# Patient Record
Sex: Male | Born: 1985 | Race: White | Hispanic: No | Marital: Single | Smoking: Never smoker
Health system: Southern US, Community
[De-identification: ages and names within clinical notes are randomized; demographics above are authoritative.]

## PROBLEM LIST (undated history)

## (undated) HISTORY — PX: WISDOM TOOTH EXTRACTION: SHX21

---

## 2014-12-03 ENCOUNTER — Ambulatory Visit
Admit: 2014-12-03 | Disposition: A | Discharge status: Home / Self Care | Payer: Self-pay | Attending: Family Medicine | Admitting: Family Medicine

## 2014-12-10 ENCOUNTER — Ambulatory Visit
Admit: 2014-12-10 | Disposition: A | Discharge status: Home / Self Care | Payer: Self-pay | Attending: Family Medicine | Admitting: Family Medicine

## 2015-02-04 ENCOUNTER — Encounter: Payer: Self-pay | Admitting: Emergency Medicine

## 2015-02-04 ENCOUNTER — Ambulatory Visit
Admission: EM | Admit: 2015-02-04 | Discharge: 2015-02-04 | Disposition: A | Discharge status: Home / Self Care | Payer: No Typology Code available for payment source | Attending: Registered Nurse | Admitting: Registered Nurse

## 2015-02-04 DIAGNOSIS — J029 Acute pharyngitis, unspecified: Secondary | ICD-10-CM | POA: Diagnosis not present

## 2015-02-04 DIAGNOSIS — H66003 Acute suppurative otitis media without spontaneous rupture of ear drum, bilateral: Secondary | ICD-10-CM | POA: Insufficient documentation

## 2015-02-04 LAB — RAPID STREP SCREEN (MED CTR MEBANE ONLY): Streptococcus, Group A Screen (Direct): NEGATIVE

## 2015-02-04 MED ORDER — AZITHROMYCIN 250 MG PO TABS: 250.0000 mg | ORAL_TABLET | Freq: Every day | ORAL | Status: DC

## 2015-02-04 MED ORDER — ACETAMINOPHEN 500 MG PO TABS: 500.0000 mg | ORAL_TABLET | Freq: Four times a day (QID) | ORAL | Status: DC | PRN

## 2015-02-04 MED ORDER — ERYTHROMYCIN BASE 250 MG PO TABS: 250.0000 mg | ORAL_TABLET | Freq: Four times a day (QID) | ORAL | Status: DC

## 2015-02-04 MED ORDER — ALUMINUM & MAGNESIUM HYDROXIDE 200-200 MG/5ML PO SUSP: 5.0000 mL | Freq: Four times a day (QID) | ORAL | Status: DC | PRN

## 2015-02-04 MED ORDER — DIPHENHYDRAMINE HCL 12.5 MG/5ML PO SYRP: 25.0000 mg | ORAL_SOLUTION | ORAL | Status: DC | PRN

## 2015-02-04 NOTE — ED Provider Notes (Signed)
CSN: [id number]     Arrival date & time 02/04/15  0709 History   None    Chief Complaint  Patient presents with  . 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 Hurts to talk. Scheduled to work today not feeling well enough.  The history is provided by the patient.    History reviewed. No pertinent past medical history. History reviewed. No pertinent past surgical history. History reviewed. No pertinent family history. History  Substance Use Topics  . Smoking status: Never Smoker   . Smokeless tobacco: Never Used  . Alcohol Use: No    Review of Systems  Constitutional: Positive for fatigue. Negative for fever, chills, diaphoresis, activity change and appetite change.  HENT: Positive for sore throat and voice change. Negative for congestion, dental problem, drooling, ear discharge, ear pain, facial swelling, hearing loss, mouth sores, nosebleeds, postnasal drip, rhinorrhea, sinus pressure, sneezing, tinnitus and trouble swallowing.   Eyes: Negative for photophobia, pain, discharge, redness, itching and visual disturbance.  Respiratory: Positive for cough. Negative for choking, chest tightness, shortness of breath, wheezing and stridor.   Cardiovascular: Negative for chest pain, palpitations and leg swelling.  Gastrointestinal: Negative for nausea, vomiting, abdominal pain, diarrhea, constipation, blood in stool, abdominal distention, anal bleeding and rectal pain.  Endocrine: Negative for cold intolerance and heat intolerance.  Genitourinary: Negative for hematuria and difficulty urinating.  Musculoskeletal: Negative for myalgias, back pain, joint swelling, arthralgias, gait problem, neck pain and neck stiffness.  Skin: Negative for color change, pallor, rash and wound.  Allergic/Immunologic: Negative  for environmental allergies and food allergies.  Neurological: Positive for headaches. Negative for dizziness, tremors, seizures, syncope, facial asymmetry, speech difficulty, weakness, light-headedness and numbness.  Hematological: Negative for adenopathy. Does not bruise/bleed easily.  Psychiatric/Behavioral: Negative for behavioral problems, confusion, sleep disturbance and agitation.    Allergies  Penicillins  Home Medications   Prior to Admission medications   Medication Sig Start Date End Date Taking? Authorizing Provider  acetaminophen (TYLENOL) 500 MG tablet Take 1 tablet (500 mg total) by mouth every 6 (six) hours as needed. 02/04/15   Barbaraann Barthel, NP  aluminum-magnesium hydroxide 200-200 MG/5ML suspension Take 5 mLs by mouth every 6 (six) hours as needed for indigestion. 02/04/15   Barbaraann Barthel, NP  azithromycin (ZITHROMAX) 250 MG tablet Take 1 tablet (250 mg total) by mouth daily. Take first 2 tablets together, then 1 every day until finished. 02/04/15   Barbaraann Barthel, NP  diphenhydrAMINE (BENYLIN) 12.5 MG/5ML syrup Take 10 mLs (25 mg total) by mouth every 4 (four) hours as needed for allergies. 02/04/15   Barbaraann Barthel, NP  erythromycin (E-MYCIN) 250 MG tablet Take 1 tablet (250 mg total) by mouth every 6 (six) hours. 02/04/15   Jarold Song Betancourt, NP   BP 119/68 mmHg  Pulse 50  Temp(Src) 96 F (35.6 C) (Tympanic)  Resp 16  Ht  (1.778 m)  Wt 145 lb (65.772 kg)  BMI 20.81 kg/m2  SpO2 99% Physical Exam  Constitutional: He is oriented to person, place, and time. Vital signs are normal. He appears well-developed and well-nourished. No distress.  HENT:  Head: Normocephalic and atraumatic.  Right Ear: Hearing, external ear and ear canal normal. Tympanic membrane is injected, erythematous and bulging. A middle ear effusion is present.  Left Ear: Hearing, external ear and ear canal normal. Tympanic membrane is injected,  erythematous and bulging. A middle ear  effusion is present.  Nose: Nose normal.  Mouth/Throat: Oropharynx is clear and moist. No oropharyngeal exudate.  Opaque air fluid level bilateral TMs; bilateral nasal turbinates with edema/erythema clear discharge; cobblestoning posterior pharynx tonsils with edema/erythema 2+/4 bilaterally nasal congestion  Eyes: Conjunctivae, EOM and lids are normal. Pupils are equal, round, and reactive to light. Right eye exhibits no discharge. Left eye exhibits no discharge. No scleral icterus.  Neck: Trachea normal and normal range of motion. Neck supple. No tracheal deviation present. No thyromegaly present.  Cardiovascular: Normal rate, regular rhythm, normal heart sounds and intact distal pulses.  Exam reveals no gallop and no friction rub.   No murmur heard. Pulmonary/Chest: Effort normal and breath sounds normal. No stridor. No respiratory distress. He has no wheezes. He has no rales. He exhibits no tenderness.  Abdominal: Soft. Bowel sounds are normal. He exhibits no distension and no mass. There is no tenderness. There is no rebound and no guarding.  Musculoskeletal: Normal range of motion. He exhibits no edema or tenderness.  Lymphadenopathy:    He has no cervical adenopathy.  Neurological: He is alert and oriented to person, place, and time. Coordination normal.  Skin: Skin is warm, dry and intact. No rash noted. He is not diaphoretic. No erythema. No pallor.  Psychiatric: He has a normal mood and affect. His speech is normal and behavior is normal. Judgment and thought content normal. Cognition and memory are normal.  Nursing note and vitals reviewed.   ED Course  Procedures (including critical care time) Labs Review Labs Reviewed  RAPID STREP SCREEN (NOT AT [facility])  CULTURE, GROUP A STREP ([facility] ONLY)    Imaging Review No results found.   MDM   1. Acute suppurative otitis media of both ears without spontaneous rupture of tympanic membranes, recurrence not specified   2. Pharyngitis     Treatment as ordered. Patient with penicillin allergy did not want to change cephalosporin use requested other medication without possible cross reactivity preferred so will Rx erythromycin and azithromycin. Symptomatic therapy suggested fluids, NSAIDs and rest.  May take Tylenol or Motrin for fevers.  Call or return to clinic as needed if these symptoms worsen or fail to improve as anticipated. Exit[employer] handout on otitis media given to patient.  Patient verbalized agreement and understanding of treatment plan.   P2:  Hand washing School/work excuse note given to patient for 24 hours. Patient notified rapid strep negative.  Throat culture results typically available in 48 hours.  If symptoms worsening/not improving on erythromycin and azithromycin consider doxycycline if positive throat culture.  Will try maalox and benadryl liquid for sore throat along with NSAID.  Consider dukes mouthwash 15ml po TID discussed with patient preferred no Rx for it at this time.  Usually no specific medical treatment is needed if a virus is causing the sore throat.  The throat most often gets better on its own within 5 to 7 days.  Antibiotic medicine does not cure viral pharyngitis.   For acute pharyngitis caused by bacteria, your healthcare provider will prescribe an antibiotic.  Marland Kitchen Do not smoke.  Marland Kitchen Avoid secondhand smoke and other air pollutants.  . Use a cool mist humidifier to add moisture to the air.  . Get plenty of rest.  . You may want to rest your throat by talking less and eating a diet that is mostly liquid or soft for a day or two.   Marland Kitchen Nonprescription throat lozenges and mouthwashes  should help relieve the soreness.   . Gargling with warm saltwater and drinking warm liquids may help.  (You can make a saltwater solution by adding 1/4 teaspoon of salt to 8 ounces, or 240 mL, of warm water.)  . A nonprescription pain reliever such as aspirin, acetaminophen, or ibuprofen may ease general aches and pains.    FOLLOW UP with clinic provider if no improvements in the next 7-10 days.  Patient verbalized understanding of instructions and agreed with plan of care. P2:  Hand washing and diet.    Barbaraann Barthel, NP 02/04/15 [phone]

## 2015-02-04 NOTE — ED Notes (Signed)
Patient c/o sore throat for the past 2 days.  Patient denies fevers.  

## 2015-02-04 NOTE — Discharge Instructions (Signed)
Pharyngitis °Pharyngitis is redness, pain, and swelling (inflammation) of your pharynx.  °CAUSES  °Pharyngitis is usually caused by infection. Most of the time, these infections are from viruses (viral) and are part of a cold. However, sometimes pharyngitis is caused by bacteria (bacterial). Pharyngitis can also be caused by allergies. Viral pharyngitis may be spread from person to person by coughing, sneezing, and personal items or utensils (cups, forks, spoons, toothbrushes). Bacterial pharyngitis may be spread from person to person by more intimate contact, such as kissing.  °SIGNS AND SYMPTOMS  °Symptoms of pharyngitis include:   °· Sore throat.   °· Tiredness (fatigue).   °· Low-grade fever.   °· Headache. °· Joint pain and muscle aches. °· Skin rashes. °· Swollen lymph nodes. °· Plaque-like film on throat or tonsils (often seen with bacterial pharyngitis). °DIAGNOSIS  °Your health care provider will ask you questions about your illness and your symptoms. Your medical history, along with a physical exam, is often all that is needed to diagnose pharyngitis. Sometimes, a rapid strep test is done. Other lab tests may also be done, depending on the suspected cause.  °TREATMENT  °Viral pharyngitis will usually get better in 3-4 days without the use of medicine. Bacterial pharyngitis is treated with medicines that kill germs (antibiotics).  °HOME CARE INSTRUCTIONS  °· Drink enough water and fluids to keep your urine clear or pale yellow.   °· Only take over-the-counter or prescription medicines as directed by your health care provider:   °¨ If you are prescribed antibiotics, make sure you finish them even if you start to feel better.   °¨ Do not take aspirin.   °· Get lots of rest.   °· Gargle with 8 oz of salt water (½ tsp of salt per 1 qt of water) as often as every 1-2 hours to soothe your throat.   °· Throat lozenges (if you are not at risk for choking) or sprays may be used to soothe your throat. °SEEK MEDICAL  CARE IF:  °· You have large, tender lumps in your neck. °· You have a rash. °· You cough up green, yellow-brown, or bloody spit. °SEEK IMMEDIATE MEDICAL CARE IF:  °· Your neck becomes stiff. °· You drool or are unable to swallow liquids. °· You vomit or are unable to keep medicines or liquids down. °· You have severe pain that does not go away with the use of recommended medicines. °· You have trouble breathing (not caused by a stuffy nose). °MAKE SURE YOU:  °· Understand these instructions. °· Will watch your condition. °· Will get help right away if you are not doing well or get worse. °Document Released: 08/03/2005 Document Revised: 05/24/2013 Document Reviewed: 04/10/2013 °ExitCare® Patient Information ©2015 [employer]. This information is not intended to replace advice given to you by your health care provider. Make sure you discuss any questions you have with your health care provider. °Otitis Media °Otitis media is redness, soreness, and inflammation of the middle ear. Otitis media may be caused by allergies or, most commonly, by infection. Often it occurs as a complication of the common cold. °SIGNS AND SYMPTOMS °Symptoms of otitis media may include: °· Earache. °· Fever. °· Ringing in your ear. °· Headache. °· Leakage of fluid from the ear. °DIAGNOSIS °To diagnose otitis media, your health care provider will examine your ear with an otoscope. This is an instrument that allows your health care provider to see into your ear in order to examine your eardrum. Your health care provider also will ask you questions about your   symptoms. °TREATMENT  °Typically, otitis media resolves on its own within 3-5 days. Your health care provider may prescribe medicine to ease your symptoms of pain. If otitis media does not resolve within 5 days or is recurrent, your health care provider may prescribe antibiotic medicines if he or she suspects that a bacterial infection is the cause. °HOME CARE INSTRUCTIONS  °· If you were  prescribed an antibiotic medicine, finish it all even if you start to feel better. °· Take medicines only as directed by your health care provider. °· Keep all follow-up visits as directed by your health care provider. °SEEK MEDICAL CARE IF: °· You have otitis media only in one ear, or bleeding from your nose, or both. °· You notice a lump on your neck. °· You are not getting better in 3-5 days. °· You feel worse instead of better. °SEEK IMMEDIATE MEDICAL CARE IF:  °· You have pain that is not controlled with medicine. °· You have swelling, redness, or pain around your ear or stiffness in your neck. °· You notice that part of your face is paralyzed. °· You notice that the bone behind your ear (mastoid) is tender when you touch it. °MAKE SURE YOU:  °· Understand these instructions. °· Will watch your condition. °· Will get help right away if you are not doing well or get worse. °Document Released: 05/08/2004 Document Revised: 12/18/2013 Document Reviewed: 02/28/2013 °ExitCare® Patient Information ©2015 [employer]. This information is not intended to replace advice given to you by your health care provider. Make sure you discuss any questions you have with your health care provider. ° °

## 2015-02-06 LAB — CULTURE, GROUP A STREP (THRC)

## 2015-07-17 ENCOUNTER — Emergency Department (HOSPITAL_COMMUNITY): Payer: No Typology Code available for payment source

## 2015-07-17 ENCOUNTER — Encounter (HOSPITAL_COMMUNITY): Payer: Self-pay | Admitting: Emergency Medicine

## 2015-07-17 ENCOUNTER — Emergency Department (HOSPITAL_COMMUNITY)
Admission: EM | Admit: 2015-07-17 | Discharge: 2015-07-17 | Disposition: A | Discharge status: Home / Self Care | Payer: No Typology Code available for payment source | Attending: Emergency Medicine | Admitting: Emergency Medicine

## 2015-07-17 DIAGNOSIS — Y92410 Unspecified street and highway as the place of occurrence of the external cause: Secondary | ICD-10-CM | POA: Diagnosis not present

## 2015-07-17 DIAGNOSIS — M25561 Pain in right knee: Secondary | ICD-10-CM

## 2015-07-17 DIAGNOSIS — Y9389 Activity, other specified: Secondary | ICD-10-CM | POA: Diagnosis not present

## 2015-07-17 DIAGNOSIS — Z880 Allergy status to penicillin: Secondary | ICD-10-CM | POA: Insufficient documentation

## 2015-07-17 DIAGNOSIS — Z792 Long term (current) use of antibiotics: Secondary | ICD-10-CM | POA: Diagnosis not present

## 2015-07-17 DIAGNOSIS — S80211A Abrasion, right knee, initial encounter: Secondary | ICD-10-CM | POA: Diagnosis not present

## 2015-07-17 DIAGNOSIS — Y998 Other external cause status: Secondary | ICD-10-CM | POA: Diagnosis not present

## 2015-07-17 DIAGNOSIS — S60512A Abrasion of left hand, initial encounter: Secondary | ICD-10-CM | POA: Insufficient documentation

## 2015-07-17 DIAGNOSIS — S60511A Abrasion of right hand, initial encounter: Secondary | ICD-10-CM | POA: Diagnosis not present

## 2015-07-17 DIAGNOSIS — T148XXA Other injury of unspecified body region, initial encounter: Secondary | ICD-10-CM

## 2015-07-17 DIAGNOSIS — S8001XA Contusion of right knee, initial encounter: Secondary | ICD-10-CM | POA: Diagnosis not present

## 2015-07-17 DIAGNOSIS — S8991XA Unspecified injury of right lower leg, initial encounter: Secondary | ICD-10-CM | POA: Diagnosis present

## 2015-07-17 MED ORDER — ACETAMINOPHEN 325 MG PO TABS
650.0000 mg | ORAL_TABLET | Freq: Once | ORAL | Status: AC
  Administered 2015-07-17: 650 mg via ORAL
  Filled 2015-07-17: qty 2

## 2015-07-17 MED ORDER — BACITRACIN ZINC 500 UNIT/GM EX OINT
TOPICAL_OINTMENT | CUTANEOUS | Status: AC
  Administered 2015-07-17: 1
  Filled 2015-07-17: qty 2.7

## 2015-07-17 MED ORDER — NAPROXEN 500 MG PO TABS: 500.0000 mg | ORAL_TABLET | Freq: Two times a day (BID) | ORAL | Status: DC | PRN

## 2015-07-17 MED ORDER — HYDROCODONE-ACETAMINOPHEN 5-325 MG PO TABS: 1.0000 | ORAL_TABLET | Freq: Four times a day (QID) | ORAL | Status: DC | PRN

## 2015-07-17 NOTE — Progress Notes (Addendum)
[facility] is listed as pcp per [employer] coverage  Pt given a 7 page list of [facility] providers within pt zip code [phone] to assist with f/u care prior to leaving
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
## 2015-12-23 DIAGNOSIS — R7302 Impaired glucose tolerance (oral): Secondary | ICD-10-CM | POA: Insufficient documentation

## 2017-09-14 ENCOUNTER — Ambulatory Visit (INDEPENDENT_AMBULATORY_CARE_PROVIDER_SITE_OTHER): Payer: BLUE CROSS/BLUE SHIELD

## 2017-09-14 ENCOUNTER — Other Ambulatory Visit: Payer: Self-pay

## 2017-09-14 ENCOUNTER — Ambulatory Visit
Admission: EM | Admit: 2017-09-14 | Discharge: 2017-09-14 | Disposition: A | Discharge status: Home / Self Care | Payer: BLUE CROSS/BLUE SHIELD | Attending: Emergency Medicine | Admitting: Emergency Medicine

## 2017-09-14 DIAGNOSIS — J9801 Acute bronchospasm: Secondary | ICD-10-CM

## 2017-09-14 MED ORDER — AEROCHAMBER PLUS MISC: 2 refills | Status: DC

## 2017-09-14 MED ORDER — ALBUTEROL SULFATE HFA 108 (90 BASE) MCG/ACT IN AERS: 1.0000 | INHALATION_SPRAY | RESPIRATORY_TRACT | 0 refills | Status: DC | PRN

## 2017-09-14 NOTE — ED Triage Notes (Addendum)
Pt reports 2 months of "feeling like theres something in my lungs."  "I want a chest xray". Pt denies cough or SOB. Denies pain. States no aggravating or alleviating factors. States it is there most of the time

## 2017-09-14 NOTE — ED Provider Notes (Signed)
HPI  SUBJECTIVE:  Mark Galvan is a 32 y.o. male who presents with "crackling in my chest" for the past 1-2 months.  He states it is worse with being in the cold for prolonged periods of time, no alleviating factors.  He has not tried anything for this.  He denies nasal congestion.  He reports rhinorrhea when exposed to cold only.  No postnasal drip, sinus pain or pressure, cough, chest pain, wheezing, shortness of breath.  No fevers, unintentional weight loss, night sweats.  No lower extremity edema, unintentional weight gain, nocturia, orthopnea, PND, abdominal pain.  No pleuritic chest pain.  He works in a Higher education careers adviserfridge and freezer at [employer] and works for [employer] where he is exposed to Roundup in the summer.  He denies any other exposure to fumes, chemicals or smoke.  No recent international travel, he is not homeless, he has not been in prison.  Past medical history negative for GERD, asthma, emphysema, COPD, smoking, TB, cancer, CHF, pneumonia.  PMD: None.  History reviewed. No pertinent past medical history.  Past Surgical History:  Procedure Laterality Date  . WISDOM TOOTH EXTRACTION      Family History  Problem Relation Age of Onset  . Healthy Mother   . Arrhythmia Father     Social History   Tobacco Use  . Smoking status: Never Smoker  . Smokeless tobacco: Current User    Types: Snuff  Substance Use Topics  . Alcohol use: No  . Drug use: No    No current facility-administered medications for this encounter.   Current Outpatient Medications:  .  albuterol (PROVENTIL HFA;VENTOLIN HFA) 108 (90 Base) MCG/ACT inhaler, Inhale 1-2 puffs into the lungs every 4 (four) hours as needed for wheezing or shortness of breath., Disp: 1 Inhaler, Rfl: 0 .  Spacer/Aero-Holding Chambers (AEROCHAMBER PLUS) inhaler, Use as instructed, Disp: 1 each, Rfl: 2  Allergies  Allergen Reactions  . Penicillins Other (See Comments)    unknown     ROS  As noted in HPI.   Physical  Exam  BP 92/71 (BP Location: Left Arm)   Pulse (!) 59   Temp 98.1 F (36.7 C) (Oral)   Resp 18   Ht 5\' 10"  (1.778 m)   Wt 145 lb (65.8 kg)   SpO2 100%   BMI 20.81 kg/m   Constitutional: Well developed, well nourished, no acute distress Eyes:  EOMI, conjunctiva normal bilaterally HENT: Normocephalic, atraumatic,mucus membranes moist Respiratory: Normal inspiratory effort. lungs clear bilaterally, good air movement.  No chest wall tenderness. Cardiovascular: Normal rate regular rhythm, no murmurs, rubs, gallops GI: nondistended skin: No rash, skin intact Musculoskeletal: no deformities, calves symmetric, nontender, no edema Neurologic: Alert & oriented x 3, no focal neuro deficits Psychiatric: Speech and behavior appropriate   ED Course   Medications - No data to display  Orders Placed This Encounter  Procedures  . DG Chest 2 View    Standing Status:   Standing    Number of Occurrences:   1    Order Specific Question:   Reason for Exam (SYMPTOM  OR DIAGNOSIS REQUIRED)    Answer:   r/p pna , effusion, pulm edema, mass, TB    No results found for this or any previous visit (from the past 24 hour(s)). Dg Chest 2 View  Result Date: 09/14/2017 CLINICAL DATA:  Pt reports 2 months of "feeling like there is something in my lungs." "I want a chest xray". Pt states it sounds and feels  like crackling. Pt denies cough or SOB. Denies pain. States no aggravating or alleviating factors. States it is there most of the time EXAM: CHEST  2 VIEW COMPARISON:  None. FINDINGS: The heart size and mediastinal contours are within normal limits. Both lungs are clear. No pleural effusion or pneumothorax. The visualized skeletal structures are unremarkable. IMPRESSION: Normal chest radiographs. Electronically Signed   By: Amie Portland M.D.   On: 09/14/2017 15:54    ED Clinical Impression  Cold induced bronchospasm   ED Assessment/Plan  Will check chest x-ray to rule out mass.  Suspect that his  symptoms are bronchospasm from working in refrigerated environments.  If chest x-ray is negative, plan to send home with an albuterol inhaler with a spacer for him to use as needed.  Doubt CHF.  Will provide a primary care referral list and order primary care referral for him to follow-up with as needed especially if this does not resolve.  Discussed  medical decision making, treatment plan and plan for follow-up with patient.  He agrees with plan.  Reviewed imaging independently.  Normal chest x-ray see radiology report for full details.  Plan as above.  Meds ordered this encounter  Medications  . albuterol (PROVENTIL HFA;VENTOLIN HFA) 108 (90 Base) MCG/ACT inhaler    Sig: Inhale 1-2 puffs into the lungs every 4 (four) hours as needed for wheezing or shortness of breath.    Dispense:  1 Inhaler    Refill:  0  . Spacer/Aero-Holding Chambers (AEROCHAMBER PLUS) inhaler    Sig: Use as instructed    Dispense:  1 each    Refill:  2    *This clinic note was created using Dragon dictation software. Therefore, there may be occasional mistakes despite careful proofreading.   ?   Domenick Gong, MD 09/14/17 1601

## 2017-09-14 NOTE — Discharge Instructions (Signed)
Take two puffs from your albuterol inhaler every 4 hours or as needed for symptoms.  Follow-up with 1 of the primary care physicians below.  Here is a list of primary care providers who are taking new patients:  Dr. Elizabeth Sauereanna Jones, Dr. Schuyler AmorWilliam Plonk [address] [place] [phone]  [facility] [place] [address]  [place] [address]  [phone]  [facility] [address]  [place], [place] [address] [phone]  [facility] [place] [address]  [phone] [place], [place] [address]  Here are clinics/ other resources who will see you if you do not have insurance. Some have certain criteria that you must meet. Call them and find out what they are:  [facility]: [address] [place], [place] [address] Phone: [phone] Hours: First and Third Saturdays of each Month, 9 a.m. - 1 p.m.  [facility]: [address], [place], [place] [address] Phone: [phone] Hours: Tuesday, 4 p.m. - 8 p.m. Thursday, 1 p.m. - 8 p.m. Wednesday, 9 a.m. - [facility]  [facility] [address], [place], [place] [address] Phone: [phone] Pharmacy Phone Number: [phone] Dental Phone Number: [phone] [facility] Insurance Help: [phone]  Dental Hours: Monday - Thursday, 8 a.m. - 6 p.m.  [facility] [address] [place], [place] [address] Phone: [phone] Pharmacy Phone Number: [phone] [facility] Insurance Help: [phone]  [facility] [address] [place], [place] [address] Phone: [phone] Pharmacy Phone Number: [phone] [facility] Insurance Help: [phone]  [facility] [address] [place], [place] [address] Phone: [phone] [facility] Insurance Help: [phone]   [facility] [address] [place], [place] [address] Phone: [phone]  Go to www.goodrx.com to look up your medications.  This will give you a list of where you can find your prescriptions at the most affordable prices. Or ask the pharmacist what the cash price is, or if they have any other discount programs available to help make your medication more affordable. This can be less expensive than what you would pay with insurance.

## 2017-12-13 DIAGNOSIS — M722 Plantar fascial fibromatosis: Secondary | ICD-10-CM | POA: Insufficient documentation

## 2018-10-24 ENCOUNTER — Other Ambulatory Visit: Payer: Self-pay

## 2018-10-24 ENCOUNTER — Encounter: Payer: Self-pay | Admitting: Emergency Medicine

## 2018-10-24 ENCOUNTER — Ambulatory Visit
Admission: EM | Admit: 2018-10-24 | Discharge: 2018-10-24 | Disposition: A | Discharge status: Home / Self Care | Payer: BLUE CROSS/BLUE SHIELD | Attending: Family Medicine | Admitting: Family Medicine

## 2018-10-24 DIAGNOSIS — M26629 Arthralgia of temporomandibular joint, unspecified side: Secondary | ICD-10-CM

## 2018-10-24 MED ORDER — MELOXICAM 15 MG PO TABS: 15.0000 mg | ORAL_TABLET | Freq: Every day | ORAL | 0 refills | Status: DC

## 2018-10-24 NOTE — Discharge Instructions (Signed)
Mouth guard over the counter

## 2018-10-24 NOTE — ED Triage Notes (Signed)
Patient c/o left side jaw pain that started 1 week ago. Denies injury, denies problems with his teeth, denies fever.

## 2018-10-24 NOTE — ED Provider Notes (Signed)
[facility]    CSN: [id number] Arrival date & time: 10/24/18  1836     History   Chief Complaint Chief Complaint  Patient presents with  . Jaw Pain    HPI Mark Galvan is a 33 y.o. male.   33 yo with a c/o left sided jaw pain for one week. Denies any injuries, trauma, fevers, chills, ear pain. Pain is worse with opening mouth wide.   The history is provided by the patient.    History reviewed. No pertinent past medical history.  There are no active problems to display for this patient.   Past Surgical History:  Procedure Laterality Date  . WISDOM TOOTH EXTRACTION         Home Medications    Prior to Admission medications   Medication Sig Start Date End Date Taking? Authorizing Provider  albuterol (PROVENTIL HFA;VENTOLIN HFA) 108 (90 Base) MCG/ACT inhaler Inhale 1-2 puffs into the lungs every 4 (four) hours as needed for wheezing or shortness of breath. 09/14/17   Domenick Gong, MD  meloxicam (MOBIC) 15 MG tablet Take 1 tablet (15 mg total) by mouth daily. 10/24/18   Payton Mccallum, MD  Spacer/Aero-Holding Chambers (AEROCHAMBER PLUS) inhaler Use as instructed 09/14/17   Domenick Gong, MD    Family History Family History  Problem Relation Age of Onset  . Healthy Mother   . Arrhythmia Father     Social History Social History   Tobacco Use  . Smoking status: Never Smoker  . Smokeless tobacco: Current User    Types: Snuff  Substance Use Topics  . Alcohol use: No  . Drug use: No     Allergies   Penicillins   Review of Systems Review of Systems   Physical Exam Triage Vital Signs ED Triage Vitals  Enc Vitals Group     BP 10/24/18 1847 120/80     Pulse Rate 10/24/18 1847 (!) 57     Resp 10/24/18 1847 18     Temp 10/24/18 1847 98 F (36.7 C)     Temp Source 10/24/18 1847 Oral     SpO2 10/24/18 1847 99 %     Weight 10/24/18 1846 145 lb (65.8 kg)     Height 10/24/18 1846 5\' 10"  (1.778 m)     Head Circumference --    Peak Flow --      Pain Score 10/24/18 1845 7     Pain Loc --      Pain Edu? --      Excl. in GC? --    No data found.  Updated Vital Signs BP 120/80 (BP Location: Right Arm)   Pulse (!) 57   Temp 98 F (36.7 C) (Oral)   Resp 18   Ht 5\' 10"  (1.778 m)   Wt 65.8 kg   SpO2 99%   BMI 20.81 kg/m   Visual Acuity Right Eye Distance:   Left Eye Distance:   Bilateral Distance:    Right Eye Near:   Left Eye Near:    Bilateral Near:     Physical Exam Vitals signs and nursing note reviewed.  Constitutional:      General: He is not in acute distress.    Appearance: He is not toxic-appearing or diaphoretic.  HENT:     Head:     Jaw: Tenderness (over left TMJ ) present.     Left Ear: Tympanic membrane normal.     Nose: No congestion or rhinorrhea.     Mouth/Throat:  Mouth: Mucous membranes are moist.     Pharynx: Oropharynx is clear.      UC Treatments / Results  Labs (all labs ordered are listed, but only abnormal results are displayed) Labs Reviewed - No data to display  EKG None  Radiology No results found.  Procedures Procedures (including critical care time)  Medications Ordered in UC Medications - No data to display  Initial Impression / Assessment and Plan / UC Course  I have reviewed the triage vital signs and the nursing notes.  Pertinent labs & imaging results that were available during my care of the patient were reviewed by me and considered in my medical decision making (see chart for details).      Final Clinical Impressions(s) / UC Diagnoses   Final diagnoses:  TMJ syndrome     Discharge Instructions     Mouth guard over the counter    ED Prescriptions    Medication Sig Dispense Auth. Provider   meloxicam (MOBIC) 15 MG tablet Take 1 tablet (15 mg total) by mouth daily. 30 tablet Payton Mccallum, MD     1. diagnosis reviewed with patient 2. rx as per orders above; reviewed possible side effects, interactions, risks and benefits   3. Recommend supportive treatment as above 4. Follow-up prn if symptoms worsen or don't improve  Controlled Substance Prescriptions [place] Controlled Substance Registry consulted? Not Applicable   Payton Mccallum, MD 10/24/18 1949

## 2019-02-23 DIAGNOSIS — Z79899 Other long term (current) drug therapy: Secondary | ICD-10-CM | POA: Insufficient documentation

## 2019-11-05 ENCOUNTER — Ambulatory Visit
Admission: EM | Admit: 2019-11-05 | Discharge: 2019-11-05 | Disposition: A | Discharge status: Home / Self Care | Payer: BLUE CROSS/BLUE SHIELD

## 2019-11-05 ENCOUNTER — Encounter: Payer: Self-pay | Admitting: Emergency Medicine

## 2019-11-05 ENCOUNTER — Other Ambulatory Visit: Payer: Self-pay

## 2019-11-05 DIAGNOSIS — M5417 Radiculopathy, lumbosacral region: Secondary | ICD-10-CM

## 2019-11-05 MED ORDER — KETOROLAC TROMETHAMINE 60 MG/2ML IM SOLN
60.0000 mg | Freq: Once | INTRAMUSCULAR | Status: AC
  Administered 2019-11-05: 60 mg via INTRAMUSCULAR

## 2019-11-05 MED ORDER — METHYLPREDNISOLONE 4 MG PO TBPK: ORAL_TABLET | ORAL | 0 refills | Status: DC

## 2019-11-05 MED ORDER — LIDOCAINE 5 % EX PTCH: 1.0000 | MEDICATED_PATCH | CUTANEOUS | 0 refills | Status: DC

## 2019-11-05 NOTE — ED Triage Notes (Addendum)
Patient in today c/o back pain radiating down left leg x 2-3 weeks, got suddenly worse 2 days ago after turning over in bed. Patient states his left leg is numb. Patient has used a muscle relaxer that he had on hand without relief. Patient has had sciatica in the past, but not this bad. Patient is unable to sit on his left hip.

## 2019-11-05 NOTE — ED Provider Notes (Signed)
[facility]    CSN: [id number] Arrival date & time: 11/05/19  1254      History   Chief Complaint Chief Complaint  Patient presents with  . Sciatica    HPI Mark Galvan is a 34 y.o. male.   HPI  34 year old male presents with a chronic history of low back pain with sciatica.  He now has had recurrence of this been lasting about 2 to 3 weeks that suddenly worsened 2 days ago when he turned over in bed.  He states that is never been this bad.  He also has noticed his left leg is numb.  Denies any bowel or bladder dysfunction.  He has no right-sided pain.  States he is unable to sit on his left hip because of the discomfort.  He has had to disruption of sleep because the pain has been so bad.  Has been taking Robaxin which she had leftover from previous back aches and has a large supply of Mobic that he says he has at home.      History reviewed. No pertinent past medical history.  There are no problems to display for this patient.   Past Surgical History:  Procedure Laterality Date  . WISDOM TOOTH EXTRACTION         Home Medications    Prior to Admission medications   Medication Sig Start Date End Date Taking? Authorizing Provider  meloxicam (MOBIC) 15 MG tablet Take 1 tablet (15 mg total) by mouth daily. 10/24/18  Yes Payton Mccallum, MD  methocarbamol (ROBAXIN) 750 MG tablet Take 750 mg by mouth 3 (three) times daily as needed. 07/03/19  Yes [provider]  sildenafil (VIAGRA) 50 MG tablet Take 1-2 tablets by mouth daily as needed for erectile dysfunction. 02/23/19  Yes [provider]  lidocaine (LIDODERM) 5 % Place 1 patch onto the skin daily. Remove & Discard patch within 12 hours or as directed by MD 11/05/19   Lutricia Feil, PA-C  methylPREDNISolone (MEDROL DOSEPAK) 4 MG TBPK tablet Take per package instructions 11/05/19   Lutricia Feil, PA-C  albuterol (PROVENTIL HFA;VENTOLIN HFA) 108 (90 Base) MCG/ACT inhaler Inhale 1-2  puffs into the lungs every 4 (four) hours as needed for wheezing or shortness of breath. 09/14/17 11/05/19  Domenick Gong, MD    Family History Family History  Problem Relation Age of Onset  . Healthy Mother   . Arrhythmia Father     Social History Social History   Tobacco Use  . Smoking status: Never Smoker  . Smokeless tobacco: Current User    Types: Snuff  Substance Use Topics  . Alcohol use: Yes    Comment: rarely  . Drug use: No     Allergies   Penicillin g and Penicillins   Review of Systems Review of Systems  Constitutional: Positive for activity change. Negative for appetite change, chills, diaphoresis, fatigue and fever.  Musculoskeletal: Positive for back pain and gait problem.  All other systems reviewed and are negative.    Physical Exam Triage Vital Signs ED Triage Vitals  Enc Vitals Group     BP 11/05/19 1429 135/75     Pulse Rate 11/05/19 1429 61     Resp 11/05/19 1429 18     Temp 11/05/19 1429 97.9 F (36.6 C)     Temp Source 11/05/19 1429 Oral     SpO2 11/05/19 1429 100 %     Weight 11/05/19 1430 155 lb (70.3 kg)  Height 11/05/19 1430 5\' 11"  (1.803 m)     Head Circumference --      Peak Flow --      Pain Score 11/05/19 1429 10     Pain Loc --      Pain Edu? --      Excl. in [place]? --    No data found.  Updated Vital Signs BP 135/75 (BP Location: Left Arm)   Pulse 61   Temp 97.9 F (36.6 C) (Oral)   Resp 18   Ht 5\' 11"  (1.803 m)   Wt 155 lb (70.3 kg)   SpO2 100%   BMI 21.62 kg/m   Visual Acuity Right Eye Distance:   Left Eye Distance:   Bilateral Distance:    Right Eye Near:   Left Eye Near:    Bilateral Near:     Physical Exam Vitals and nursing note reviewed.  Constitutional:      General: He is not in acute distress.    Appearance: Normal appearance. He is normal weight. He is not ill-appearing, toxic-appearing or diaphoretic.  HENT:     Head: Normocephalic and atraumatic.  Eyes:     Conjunctiva/sclera:  Conjunctivae normal.  Musculoskeletal:        General: Tenderness present.     Cervical back: Normal range of motion and neck supple.  Skin:    General: Skin is warm and dry.  Neurological:     Mental Status: He is alert and oriented to person, place, and time.     Sensory: Sensory deficit present.     Motor: Weakness present.     Gait: Gait abnormal.     Deep Tendon Reflexes: Reflexes abnormal.     Comments: Examination of the lumbar spine shows a slightly higher pelvis on the left in stance.  He is able to forward flex with his hands to the level of his knees.  Regaining upright posture is difficult.  Left lateral flexion causes him to have left-sided back and leg pain.  Right lateral flexion does not cause discomfort.  He has discomfort to deep palpation in the paraspinous muscles at the superior edge of the sacrum.  Does have mild inferior sacroiliac joint pain and tenderness in the sciatic notch on the left.  He is weak on toe walking on the left stating that he is numb on the ball of his foot is able to walk on his heels without difficulty.  EHL peroneal and anterior tibialis are strong to clinical testing.  Sensations shows a slight hip esthesia touch to the lateral foot as well as the ball of the foot.  DTR shows an absent ankle jerk on the left all others are hyperreflexic at 3/4.  The leg raise testing on the left in the seated position at 90 degrees is positive with a left leg pain and left buttocks pain on the right is negative.  Psychiatric:        Mood and Affect: Mood normal.        Behavior: Behavior normal.        Thought Content: Thought content normal.        Judgment: Judgment normal.      UC Treatments / Results  Labs (all labs ordered are listed, but only abnormal results are displayed) Labs Reviewed - No data to display  EKG   Radiology No results found.  Procedures Procedures (including critical care time)  Medications Ordered in UC Medications  ketorolac  (TORADOL) injection 60 mg (60  mg Intramuscular Given 11/05/19 1519)    Initial Impression / Assessment and Plan / UC Course  I have reviewed the triage vital signs and the nursing notes.  Pertinent labs & imaging results that were available during my care of the patient were reviewed by me and considered in my medical decision making (see chart for details).   34 year old male resents with a 2 to 3-week history of left-sided sciatica that was exacerbated 2 days ago after he turned over in bed.  She has had chronic sciatica in the past but this is the worst episode he can remember.  He is try to use muscle relaxers that he had leftover which was Robaxin but states that it has not helped.  Is it is very painful to sit on the left side.  He also has noticed that he is limping more when he is ambulating.  He is complaining of foot numbness lateral and also in the ball of his foot.  He does not remember any injury at this time other than turning over in bed.  Examination today was bothersome for her a weakness that he had with toe walking although muscle strength to clinical resistance was intact.  He does have an absent ankle jerk on the left.  Because of his level of pain and the findings on physical exam this is suggestive of a S1 radiculopathy I have recommended that he follow-up with his primary care this week for possible MRI or referral to a spinal physician for further evaluation.  I have given him a Medrol Dosepak for anti-inflammatory effect.  I will also provide him with lidocaine patches for topical pain relief.  If he develops any bowel or bladder dysfunction he should go to emergency immediately to the emergency room.  Otherwise he will contact his primary care physician this week for further instructions.  I have asked him also to avoid symptoms of much as possible particularly with prolonged sitting lifting or bending.  He was given an injection of Toradol 60 mg IM.     Final Clinical  Impressions(s) / UC Diagnoses   Final diagnoses:  Lumbosacral radiculopathy at S1     Discharge Instructions     Avoid symptoms as much as possible.  Use lidocaine patches over the area most tenderness.  Take meloxicam this evening and then start your prednisone dosing tomorrow.  Schedule an appointment with your primary care physician as soon as possible.    ED Prescriptions    Medication Sig Dispense Auth. Provider   methylPREDNISolone (MEDROL DOSEPAK) 4 MG TBPK tablet Take per package instructions 21 tablet Ovid Curd P, PA-C   lidocaine (LIDODERM) 5 % Place 1 patch onto the skin daily. Remove & Discard patch within 12 hours or as directed by MD 6 patch Lutricia Feil, PA-C     PDMP not reviewed this encounter.   Lutricia Feil, PA-C 11/05/19 1805

## 2019-11-05 NOTE — Discharge Instructions (Addendum)
Avoid symptoms as much as possible.  Use lidocaine patches over the area most tenderness.  Take meloxicam this evening and then start your prednisone dosing tomorrow.  Schedule an appointment with your primary care physician as soon as possible.

## 2019-12-11 ENCOUNTER — Other Ambulatory Visit: Payer: Self-pay | Admitting: Physical Medicine & Rehabilitation

## 2019-12-11 ENCOUNTER — Other Ambulatory Visit (HOSPITAL_COMMUNITY): Payer: Self-pay | Admitting: Physical Medicine & Rehabilitation

## 2019-12-11 DIAGNOSIS — M5416 Radiculopathy, lumbar region: Secondary | ICD-10-CM

## 2019-12-11 DIAGNOSIS — M5442 Lumbago with sciatica, left side: Secondary | ICD-10-CM | POA: Insufficient documentation

## 2019-12-23 ENCOUNTER — Other Ambulatory Visit: Payer: Self-pay

## 2019-12-23 ENCOUNTER — Ambulatory Visit
Admission: RE | Admit: 2019-12-23 | Discharge: 2019-12-23 | Disposition: A | Discharge status: Home / Self Care | Payer: BC Managed Care – PPO | Source: Ambulatory Visit | Attending: Physical Medicine & Rehabilitation | Admitting: Physical Medicine & Rehabilitation

## 2019-12-23 DIAGNOSIS — M5416 Radiculopathy, lumbar region: Secondary | ICD-10-CM | POA: Insufficient documentation

## 2020-01-01 DIAGNOSIS — M5116 Intervertebral disc disorders with radiculopathy, lumbar region: Secondary | ICD-10-CM | POA: Insufficient documentation

## 2020-11-18 IMAGING — MR MR LUMBAR SPINE W/O CM
4 series · 32 of 48 positions shown · non-contrast
Comparison: Prior radiograph from 11/28/2019.

CLINICAL DATA: Initial evaluation for left lower extremity numbness
to foot for 1 month.

EXAM:
MRI LUMBAR SPINE WITHOUT CONTRAST
TECHNIQUE: Multiplanar, multisequence MR imaging of the lumbar spine was
performed. No intravenous contrast was administered.

[Series 5: T2 · sagittal · 4.0mm · 0.81mm/px · 8 of 17 slices shown (1 of 2)]
[im 1/17]
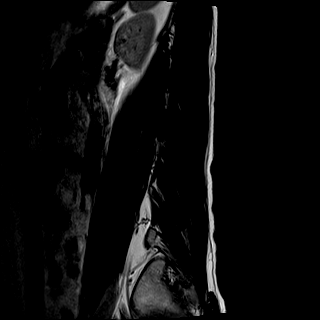
[im 2/17]
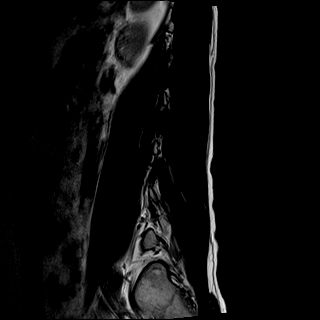
[im 6/17]
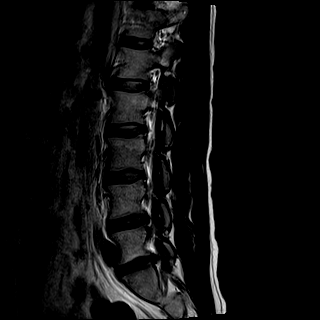
[im 8/17]
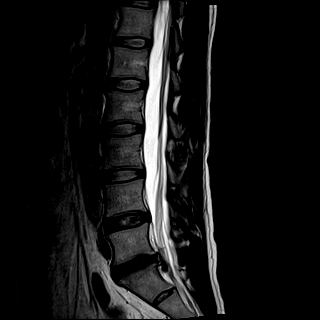
[im 9/17]
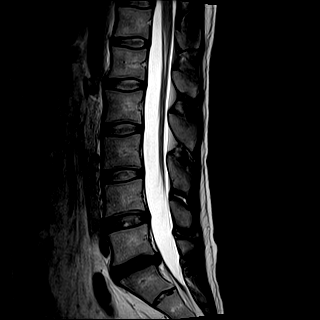
[im 11/17]
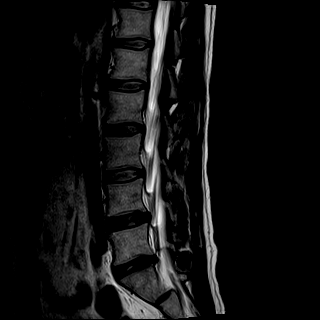
[im 15/17]
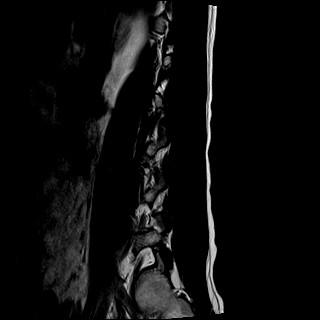
[im 17/17]
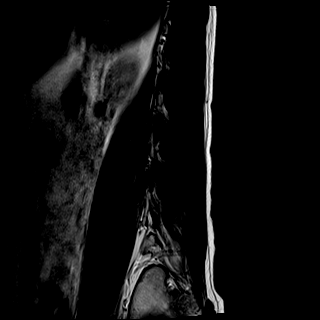

[Series 6: T1 · sagittal · 4.0mm · 0.81mm/px · 9 of 17 slices shown]
[im 1/17]
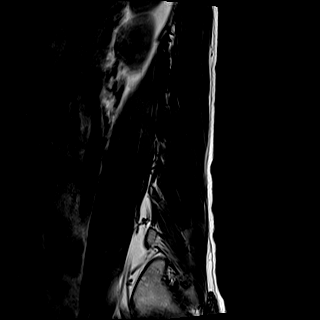
[im 3/17]
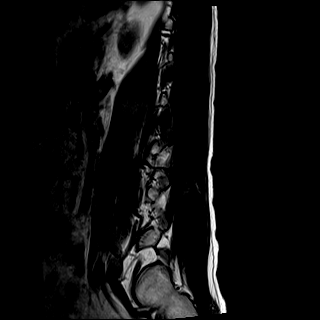
[im 5/17]
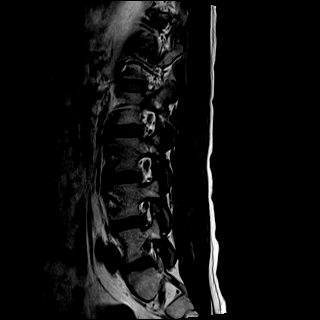
[im 7/17]
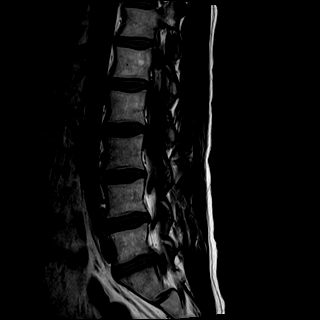
[im 9/17]
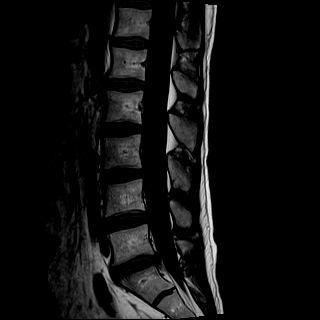
[im 11/17]
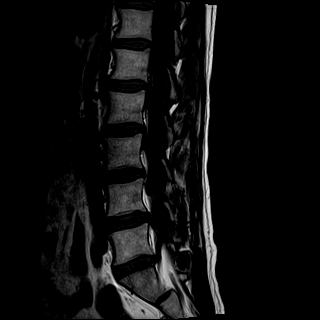
[im 13/17]
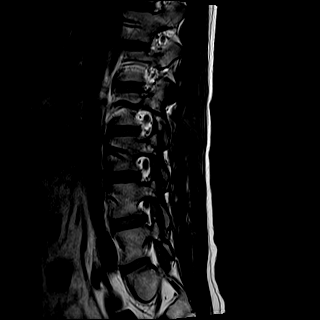
[im 15/17]
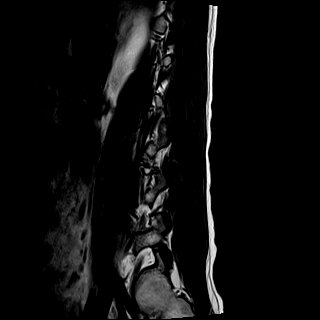
[im 17/17]
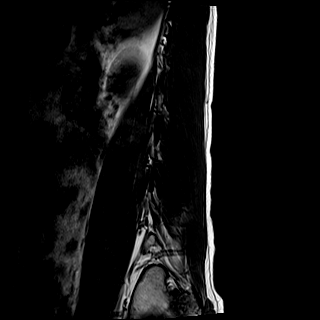

[Series 7: STIR · sagittal · 4.0mm · 0.41mm/px · 6 of 17 slices shown]
[im 1/17]
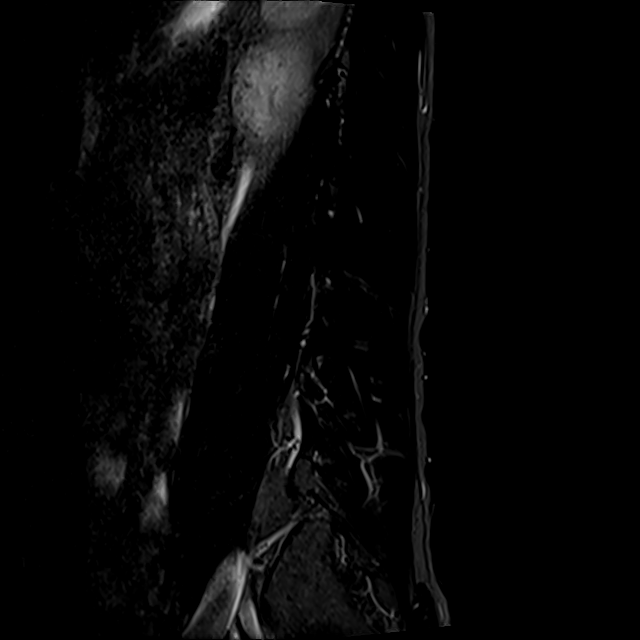
[im 3/17]
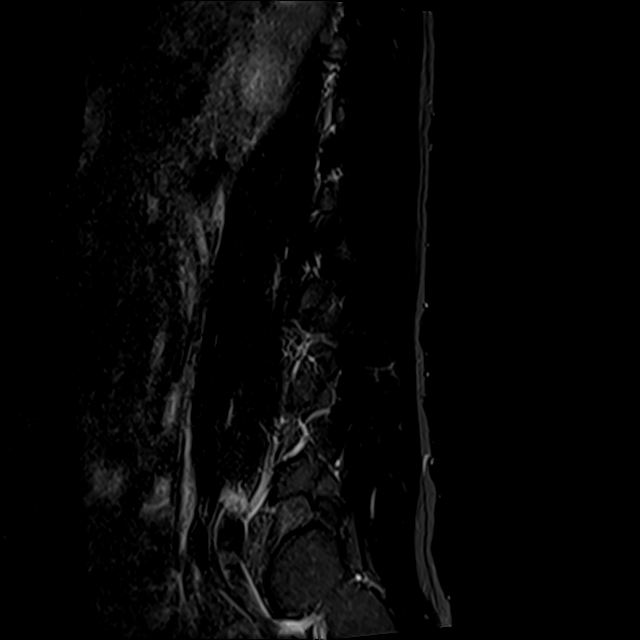
[im 5/17]
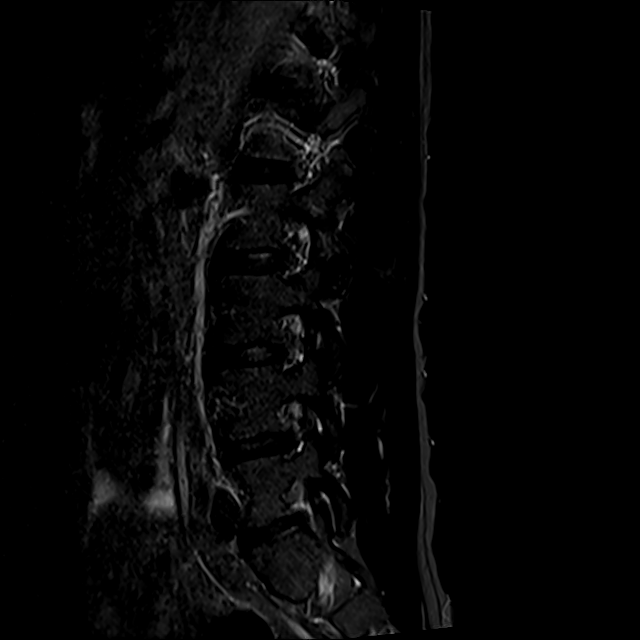
[im 7/17]
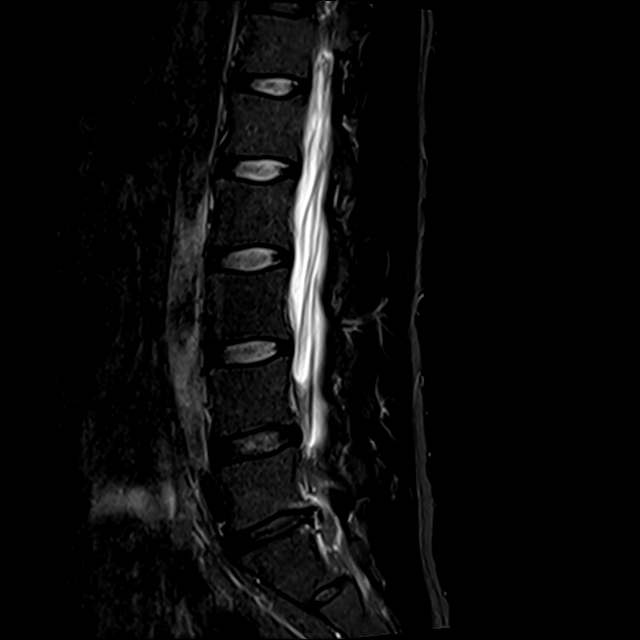
[im 9/17]
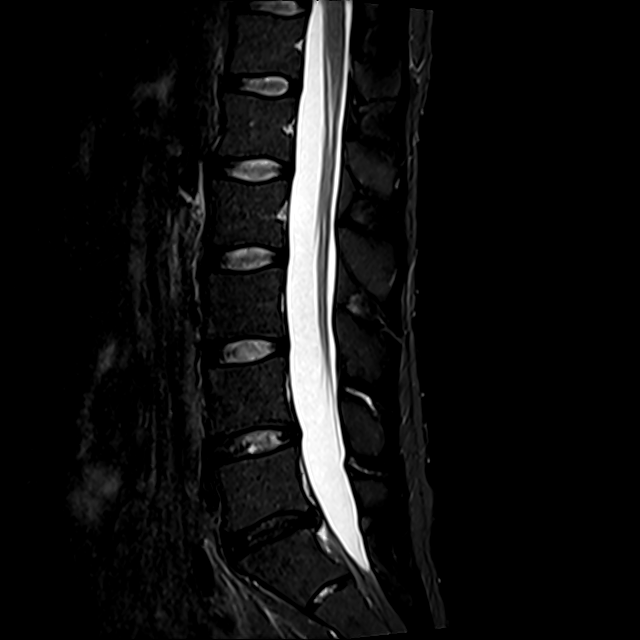
[im 15/17]
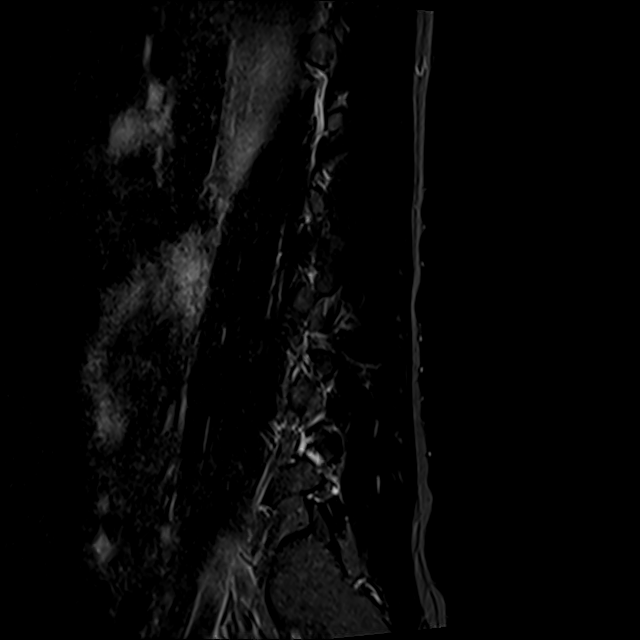

[Series 8: T2 · axial · 4.0mm · 0.78mm/px · z∈[-155,+51]mm · 9 of 36 slices shown (2 of 2)]
[im 2/36]
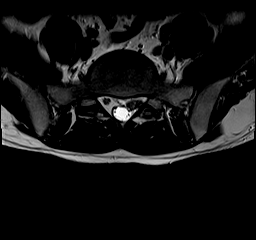
[im 6/36]
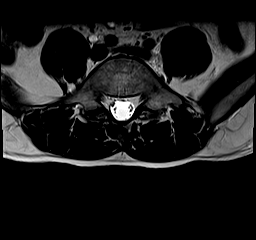
[im 12/36]
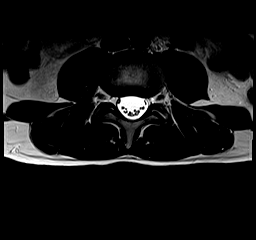
[im 15/36]
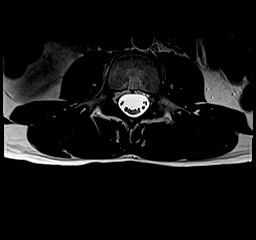
[im 19/36]
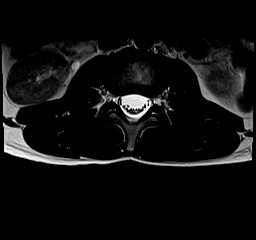
[im 21/36]
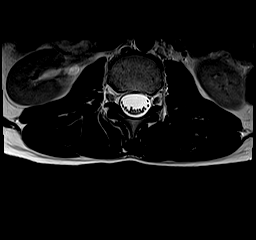
[im 24/36]
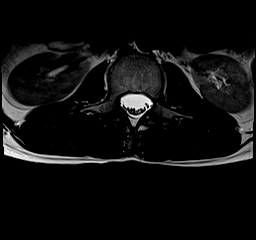
[im 30/36]
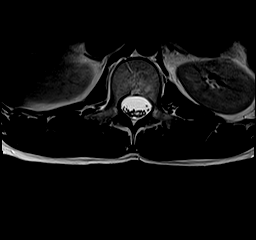
[im 34/36]
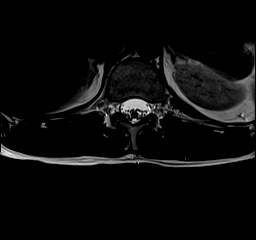

[32 of 48 positions shown; findings below may reference images not displayed]

FINDINGS: Segmentation: Standard. Lowest well-formed disc space labeled the
L5-S1 level.

Alignment: Physiologic with preservation of the normal lumbar
lordosis. No listhesis.

Vertebrae: Vertebral body height maintained without evidence for
acute or chronic fracture. Bone marrow signal intensity within
normal limits. No discrete or worrisome osseous lesions. No abnormal
marrow edema.

Conus medullaris and cauda equina: Conus extends to the T12-L1
level. Conus and cauda equina appear normal.

Paraspinal and other soft tissues: Unremarkable.

Disc levels:

L1-2:  Unremarkable.

L2-3:  Unremarkable.

L3-4:  Unremarkable.

L4-5: Mild diffuse disc bulge with disc desiccation. Disc bulging
slightly asymmetric to the right with associated right foraminal to
extraforaminal annular fissure, closely approximating the exiting
right L4 nerve root (series 8, image 28). No significant canal or
lateral recess stenosis. Mild bilateral L4 foraminal stenosis.

L5-S1: Diffuse disc bulge with disc desiccation and intervertebral
disc space narrowing. Mild reactive endplate changes. Superimposed
left subarticular disc extrusion with inferior migration, impinging
upon the descending left S1 nerve root (series 9, image 35).
Associated annular fissure. Moderate to severe left lateral recess
stenosis. Foramina remain patent.
IMPRESSION: 1. Left subarticular disc extrusion with inferior migration at
L5-S1, impinging upon the descending left S1 nerve.
2. Mild disc bulge with associated right foraminal to extraforaminal
disc protrusion at L4-5, closely approximating and potentially
irritating the exiting right L4 nerve root.

## 2021-11-27 ENCOUNTER — Other Ambulatory Visit: Payer: Self-pay

## 2021-11-27 ENCOUNTER — Ambulatory Visit
Admission: EM | Admit: 2021-11-27 | Discharge: 2021-11-27 | Disposition: A | Discharge status: Home / Self Care | Payer: BC Managed Care – PPO | Attending: Emergency Medicine | Admitting: Emergency Medicine

## 2021-11-27 DIAGNOSIS — S8012XA Contusion of left lower leg, initial encounter: Secondary | ICD-10-CM

## 2021-11-27 MED ORDER — IBUPROFEN 600 MG PO TABS: 600.0000 mg | ORAL_TABLET | Freq: Four times a day (QID) | ORAL | 0 refills | Status: DC | PRN

## 2021-11-27 MED ORDER — DOXYCYCLINE HYCLATE 100 MG PO CAPS: 100.0000 mg | ORAL_CAPSULE | Freq: Two times a day (BID) | ORAL | 0 refills | Status: AC

## 2021-11-27 NOTE — ED Triage Notes (Signed)
Pt is here with right leg pain/swelling that started 4 day ago, pt has not taken any meds to relieve discomfort. ?

## 2021-11-27 NOTE — Discharge Instructions (Addendum)
I am going to treat this as a contusion/bruise with ice, compression, rest and elevation.  However, I am also going to send you home on doxycycline in case this has an underlying infection.  You can take 1000 mg of Tylenol combined with 600 mg of ibuprofen as needed for pain.  Return here if not better after finishing the doxycycline, go to the ER for the signs and symptoms listed above. ? ?Here is a list of primary care providers who are taking new patients: ? ?[facility] [place] ?Dr. Joseph Berkshire (sports medicine) ?Dr. Elizabeth Sauer ?[address] ?[address] ?[place] [address] ?[phone] ? ?[facility] ?[address] [place], [place] [address] ?[phone] ? ?[facility] [place] ?[address] [place] [address]  ?[place] [address]  ?[phone] ? ?[facility] ?[address]  ?[place], [place] [address] ?[phone] ? ?[facility] ?[address]  ?[phone] ?[place], [place] [address] ? ?Here are clinics/ other resources who will see you if you do not have insurance. Some have certain criteria that you must meet. Call them and find out what they are: ? ?[facility]: ?[address], [place], [place] [address] ?Phone: [phone] ?Hours: First and Third Saturdays of each Month, 9 a.m. - 1 p.m. ? ?[facility]: ?[address], [place], [place] [address] ?Phone: [phone] ?Hours: ?Tuesday, 4 p.m. - 8 p.m. ?Thursday, 1 p.m. - 8 p.m. ?Wednesday, 9 a.m. - Noon ? ?[facility] ?[address], [place], [place] [address] ?Phone: [phone] ?Pharmacy Phone Number: [phone] ?Dental Phone Number: [phone] ?ACA Insurance Help: [phone] ? ?Dental Hours: ?Monday - Thursday, 8 a.m. - 6 p.m. ? ?[facility] ?[address] [place], [place] [address] ?Phone: [phone] ?Pharmacy Phone Number: [phone] ?ACA Insurance Help: [id number] ? ?[facility] ?[address] [place], [place]  [address] ?Phone: [phone] ?Pharmacy Phone Number: [phone] ?ACA Insurance Help: [phone] ? ?[facility] ?[address] [place], [place] [address] ?Phone: [phone] ?ACA Insurance Help: [phone]  ? ?[facility] ?[address] [place], [place] [address] ?Phone: [phone] ? ?Go to www.goodrx.com  or www.costplusdrugs.com to look up your medications. This will give you a list of where you can find your prescriptions at the most affordable prices. Or ask the pharmacist what the cash price is, or if they have any other discount programs available to help make your medication more affordable. This can be less expensive than what you would pay with insurance.   ?

## 2021-11-27 NOTE — ED Provider Notes (Signed)
HPI ? ?SUBJECTIVE: ? ?Mark Galvan is a 36 y.o. male who presents with 4 days of left posterior calf pain, swelling and bruising.  It has not changed in size since it started.  It is just on the gastrocnemius, and is not involved the entire calf.  He states that it is tender to palpation only, he has numbness in this leg from his back issues.  He does not recall any trauma to the area, known insect bite, itching, burning, erythema, purulent drainage.  No change in his physical activity.  He has never had symptoms like this before.  He has not tried anything for this.  No alleviating factors.  Symptoms are worse with palpation.  Is not associated with knee or ankle range of motion.  He has a past medical history of chronic back issues, especially on the left side.  No history of MRSA.  PCP: None. ? ?History reviewed. No pertinent past medical history. ? ?Past Surgical History:  ?Procedure Laterality Date  ? WISDOM TOOTH EXTRACTION    ? ? ?Family History  ?Problem Relation Age of Onset  ? Healthy Mother   ? Arrhythmia Father   ? ? ?Social History  ? ?Tobacco Use  ? Smoking status: Never  ? Smokeless tobacco: Current  ?  Types: Snuff  ?Vaping Use  ? Vaping Use: Never used  ?Substance Use Topics  ? Alcohol use: Yes  ?  Comment: rarely  ? Drug use: No  ? ? ?No current facility-administered medications for this encounter. ? ?Current Outpatient Medications:  ?  doxycycline (VIBRAMYCIN) 100 MG capsule, Take 1 capsule (100 mg total) by mouth 2 (two) times daily for 5 days., Disp: 10 capsule, Rfl: 0 ?  ibuprofen (ADVIL) 600 MG tablet, Take 1 tablet (600 mg total) by mouth every 6 (six) hours as needed., Disp: 30 tablet, Rfl: 0 ?  lidocaine (LIDODERM) 5 %, Place 1 patch onto the skin daily. Remove & Discard patch within 12 hours or as directed by MD, Disp: 6 patch, Rfl: 0 ?  sildenafil (VIAGRA) 50 MG tablet, Take 1-2 tablets by mouth daily as needed for erectile dysfunction., Disp: , Rfl:  ? ?Allergies  ?Allergen  Reactions  ? Penicillin G   ?  Other reaction(s): Unknown ?As a child  ? Penicillins Other (See Comments)  ?  unknown  ? ? ? ?ROS ? ?As noted in HPI.  ? ?Physical Exam ? ?BP (!) 134/93 (BP Location: Right Arm)   Pulse 99   Temp 98.6 ?F (37 ?C) (Oral)   Resp 18   SpO2 99%  ? ?Constitutional: Well developed, well nourished, no acute distress ?Eyes:  EOMI, conjunctiva normal bilaterally ?HENT: Normocephalic, atraumatic,mucus membranes moist ?Respiratory: Normal inspiratory effort ?Cardiovascular: Normal rate ?GI: nondistended ?skin: See musculoskeletal exam ?Musculoskeletal: Discrete 3 x 3 tender flesh-colored mass/induration posterior left calf with 8 x 8.5 cm bruising surrounding it.  No increased temperature, erythema,, expressible purulent drainage.  No popliteal tenderness.  No palpable cord.  Thompson test negative.  No other calf tenderness.  Sensation and motor grossly intact distally.  PT 2+.  No pain with range of motion of the knee or ankle. ? ? ?Neurologic: Alert & oriented x 3, no focal neuro deficits ?Psychiatric: Speech and behavior appropriate ? ? ?ED Course ? ? ?Medications - No data to display ? ?No orders of the defined types were placed in this encounter. ? ? ?No results found for this or any previous visit (from the past 24  hour(s)). ?No results found. ? ?ED Clinical Impression ? ?1. Contusion of left calf, initial encounter   ?  ? ?ED Assessment/Plan ? ?This does not appear to be an abscess in the absence of erythema, increased temperature.  It does not have any signs of necrosis which would be suggestive of a spider bite.  He denies any known trauma to the area, but he does chronic report numbness in this leg.  No evidence of Achilles rupture, Baker's cyst.  This mass is surrounded with bruising, not erythema, patient does not recall tick bite. It does does not appear to be erythema migrans.  No evidence of DVT. ? ?Patient with a calf contusion.   will have him try rest, ice, compression and  elevation.  Ace wrap.  Will also send home with 5 days of doxycycline in case there is an underlying infection.  Will provide primary care list and order assistance in finding a PCP.  Follow-up here if it gets worse.  ER return precautions given. ? ?Discussed MDM, treatment plan, and plan for follow-up with patient. Discussed sn/sx that should prompt return to the ED. patient agrees with plan.  ? ?Meds ordered this encounter  ?Medications  ? doxycycline (VIBRAMYCIN) 100 MG capsule  ?  Sig: Take 1 capsule (100 mg total) by mouth 2 (two) times daily for 5 days.  ?  Dispense:  10 capsule  ?  Refill:  0  ? ibuprofen (ADVIL) 600 MG tablet  ?  Sig: Take 1 tablet (600 mg total) by mouth every 6 (six) hours as needed.  ?  Dispense:  30 tablet  ?  Refill:  0  ? ? ? ? ?*This clinic note was created using Lobbyist. Therefore, there may be occasional mistakes despite careful proofreading. ? ?? ? ?  ?Melynda Ripple, MD ?11/27/21 1521 ? ?

## 2022-01-31 ENCOUNTER — Emergency Department
Admission: EM | Admit: 2022-01-31 | Discharge: 2022-01-31 | Disposition: A | Discharge status: Home / Self Care | Payer: BC Managed Care – PPO | Attending: Emergency Medicine | Admitting: Emergency Medicine

## 2022-01-31 ENCOUNTER — Encounter: Payer: Self-pay | Admitting: Emergency Medicine

## 2022-01-31 ENCOUNTER — Ambulatory Visit
Admission: EM | Admit: 2022-01-31 | Discharge: 2022-01-31 | Disposition: A | Discharge status: Other Acute Inpatient Hospital | Payer: BC Managed Care – PPO

## 2022-01-31 ENCOUNTER — Other Ambulatory Visit: Payer: Self-pay

## 2022-01-31 DIAGNOSIS — M5416 Radiculopathy, lumbar region: Secondary | ICD-10-CM | POA: Diagnosis not present

## 2022-01-31 DIAGNOSIS — M79605 Pain in left leg: Secondary | ICD-10-CM | POA: Diagnosis present

## 2022-01-31 NOTE — ED Notes (Signed)
Pt stated he had sciatica approx. 3 yrs ago....has had numbness ever since. Pt stated he had a bruise come up on his L calf, and was cleared by [place]. Pt has now the same type of bruise., on his L thigh. L leg is weaker than L. Ever since the 3 yr ago incident. Pt has had MRI, and was diagnosed as a "Pinched nerve."

## 2022-01-31 NOTE — ED Triage Notes (Signed)
Pt via POV from home. Pt c/o L leg bruising on the thigh. Pt denies any injury. States he noticed it about a week ago. Pt states it is tender to touch. Denies any blood thinner. Pt is A&Ox4 and NAD

## 2022-01-31 NOTE — ED Provider Notes (Signed)
[facility] Provider Note    Event Date/Time   First MD Initiated Contact with Patient 01/31/22 0900     (approximate)   History   Leg Pain   HPI  Waleed Decorey Wahlert is a 36 y.o. male presents emergency department with a bruise on the anterior portion of his left thigh, complaining of left thigh and leg weakness that is been ongoing for over a year.  Had an MRI about a year and a half ago.  States he was told he had sciatica.  States he was worried about the knot in the center of the bruise.  The bruise has been present for over a week.  Denies any bleeding from his gums or other bruising.      Physical Exam   Triage Vital Signs: ED Triage Vitals  Enc Vitals Group     BP 01/31/22 0846 (!) 139/91     Pulse Rate 01/31/22 0846 96     Resp 01/31/22 0846 20     Temp 01/31/22 0846 97.6 F (36.4 C)     Temp Source 01/31/22 0846 Oral     SpO2 01/31/22 0846 100 %     Weight 01/31/22 0844 165 lb (74.8 kg)     Height 01/31/22 0844 5\' 11"  (1.803 m)     Head Circumference --      Peak Flow --      Pain Score 01/31/22 0844 0     Pain Loc --      Pain Edu? --      Excl. in [place]? --     Most recent vital signs: Vitals:   01/31/22 0846  BP: (!) 139/91  Pulse: 96  Resp: 20  Temp: 97.6 F (36.4 C)  SpO2: 100%     General: Awake, no distress.   CV:  Good peripheral perfusion. regular rate and  rhythm Resp:  Normal effort.  Abd:  No distention.   Other:  Left thigh with a very old bruise noted started turning yellow, hard area in the center which is superficial, patient is able to ambulate without difficulty   ED Results / Procedures / Treatments   Labs (all labs ordered are listed, but only abnormal results are displayed) Labs Reviewed - No data to display   EKG     RADIOLOGY     PROCEDURES:   Procedures   MEDICATIONS ORDERED IN ED: Medications - No data to display   IMPRESSION / MDM / ASSESSMENT AND PLAN / ED COURSE  I  reviewed the triage vital signs and the nursing notes.                              Differential diagnosis includes, but is not limited to, contusion, sciatica, hematoma  Patient's presentation is most consistent with acute, uncomplicated illness.   I do not feel that the patient needs blood work or imaging.  This is a chronic issue.  He is to follow-up with orthopedics.  Feel that the numbness and tingling in his leg and weakness is due to his back.  This has been a chronic ongoing issue and can be followed up with orthopedics.  I do not feel that he has a clotting disorder as he does not have any other bruising other than one area on his thigh.  He has not had any bleeding from his gums etc.  He was discharged in stable condition with a  work note.      FINAL CLINICAL IMPRESSION(S) / ED DIAGNOSES   Final diagnoses:  Lumbar radicular pain     Rx / DC Orders   ED Discharge Orders     None        Note:  This document was prepared using Dragon voice recognition software and may include unintentional dictation errors.    Faythe Ghee, PA-C 01/31/22 4970    Delton Prairie, MD 01/31/22 1344

## 2022-01-31 NOTE — Discharge Instructions (Signed)
Follow-up with your regular doctor if not improving.  Call orthopedics for an appointment.  Tell them you are seen in the emergency department and we recommended you be seen.  Apply warm compress to the bruised area this will help your body reabsorb the bruising.  If you begin to have bleeding from your gums or other frequent bruising all over your body please return as you will need blood work

## 2023-01-01 ENCOUNTER — Ambulatory Visit
Admission: EM | Admit: 2023-01-01 | Discharge: 2023-01-01 | Disposition: A | Discharge status: Home / Self Care | Payer: 59 | Attending: Family Medicine | Admitting: Family Medicine

## 2023-01-01 DIAGNOSIS — H9201 Otalgia, right ear: Secondary | ICD-10-CM | POA: Diagnosis not present

## 2023-01-01 MED ORDER — CIPROFLOXACIN-DEXAMETHASONE 0.3-0.1 % OT SUSP: 4.0000 [drp] | Freq: Two times a day (BID) | OTIC | 0 refills | Status: DC

## 2023-01-01 NOTE — Discharge Instructions (Signed)
Stop by the pharmacy to pick up your prescriptions.  Follow up with your primary care provider as needed.  

## 2023-01-01 NOTE — ED Provider Notes (Signed)
[facility]    CSN: [id number] Arrival date & time: 01/01/23  1815      History   Chief Complaint Chief Complaint  Patient presents with   Foreign Body in Ear    HPI Mark Galvan is a 37 y.o. male.   HPI   Mark Galvan presents for right ear pain with concern of foreign body in his ear. He feels like there is something in there after doing brush work in the yard yesterday. He flushed his ear with peroxide prior to arrival.  He has no associated {Symptoms; ear pain:5007}.     Fever : no  Chills: no Sore throat: no   Cough: no Sputum: no Nasal congestion : no  Rhinorrhea: no Myalgias: no Appetite: normal  Hydration: normal  Abdominal pain: no Nausea: no Vomiting: no Sleep disturbance: no Headache: no      History reviewed. No pertinent past medical history.  There are no problems to display for this patient.   Past Surgical History:  Procedure Laterality Date   WISDOM TOOTH EXTRACTION         Home Medications    Prior to Admission medications   Medication Sig Start Date End Date Taking? Authorizing Provider  ciprofloxacin-dexamethasone (CIPRODEX) OTIC suspension Place 4 drops into the right ear 2 (two) times daily. For 7 days 01/01/23  Yes Shanece Cochrane, DO  ibuprofen (ADVIL) 600 MG tablet Take 1 tablet (600 mg total) by mouth every 6 (six) hours as needed. 11/27/21  Yes Domenick Gong, MD  lidocaine (LIDODERM) 5 % Place 1 patch onto the skin daily. Remove & Discard patch within 12 hours or as directed by MD 11/05/19  Yes Lutricia Feil, PA-C  sildenafil (VIAGRA) 50 MG tablet Take 1-2 tablets by mouth daily as needed for erectile dysfunction. 02/23/19  Yes [provider]  albuterol (PROVENTIL HFA;VENTOLIN HFA) 108 (90 Base) MCG/ACT inhaler Inhale 1-2 puffs into the lungs every 4 (four) hours as needed for wheezing or shortness of breath. 09/14/17 11/05/19  Domenick Gong, MD    Family History Family History  Problem  Relation Age of Onset   Healthy Mother    Arrhythmia Father     Social History Social History   Tobacco Use   Smoking status: Never   Smokeless tobacco: Former    Types: Snuff  Vaping Use   Vaping Use: Never used  Substance Use Topics   Alcohol use: Yes    Comment: rarely   Drug use: No     Allergies   Penicillin g and Penicillins   Review of Systems Review of Systems: :negative unless otherwise stated in HPI.      Physical Exam Triage Vital Signs ED Triage Vitals  Enc Vitals Group     BP 01/01/23 1858 131/87     Pulse Rate 01/01/23 1858 91     Resp --      Temp --      Temp src --      SpO2 01/01/23 1858 96 %     Weight 01/01/23 1856 175 lb (79.4 kg)     Height 01/01/23 1856 5\' 10"  (1.778 m)     Head Circumference --      Peak Flow --      Pain Score 01/01/23 1855 0     Pain Loc --      Pain Edu? --      Excl. in GC? --    No data found.  Updated Vital Signs BP  131/87 (BP Location: Left Arm)   Pulse 91   Ht 5\' 10"  (1.778 m)   Wt 79.4 kg   SpO2 96%   BMI 25.11 kg/m   Visual Acuity Right Eye Distance:   Left Eye Distance:   Bilateral Distance:    Right Eye Near:   Left Eye Near:    Bilateral Near:     Physical Exam GEN:     alert, non-toxic appearing male in no distress ***   HENT:  mucus membranes moist, oropharyngeal ***without lesions or ***exudate, no tonsillar hypertrophy***, *** no erythema , *** moderate turbinate hypertrophy, ***clear nasal discharge, right TM ***, left TM ***, normal external auditory canals bilaterally, nontender tragus ENT Physical Exam   EYES:   ***no scleral injection NECK:  normal ROM, no ***lymphadenopathy, ***no meningismus   RESP:  no increased work of breathing, clear to auscultation bilaterally,  *** CVS:   regular rate and rhythm*** Skin:   warm and dry, no rash on visible skin***, normal*** skin turgor    UC Treatments / Results  Labs (all labs ordered are listed, but only abnormal results are  displayed) Labs Reviewed - No data to display  EKG   Radiology No results found.  Procedures Procedures (including critical care time)  Medications Ordered in UC Medications - No data to display  Initial Impression / Assessment and Plan / UC Course  I have reviewed the triage vital signs and the nursing notes.  Pertinent labs & imaging results that were available during my care of the patient were reviewed by me and considered in my medical decision making (see chart for details).     ***   ***Acute Otitis media Overall patient is well-appearing, well-hydrated and without respiratory distress. [name] is afebrile. Treat with amoxicillin for 10 days.  Tylenol/Motrin's as needed for fever or discomfort.  Stressed importance of hydration.  Work/school *** note provided, per request.   ***Cerumen Impaction  Ceruminosis is noted.  Wax is removed by syringing and manual debridement. Instructions for home care to prevent wax buildup are given.  ***History consistent with ***viral respiratory illness. Overall pt is well appearing, well hydrated, without respiratory distress. Discussed symptomatic treatment. ***COVID testing obtained and was negative. Pt to quarantine until COVID test results or longer if positive. Explained lack of efficacy of antibiotics in viral disease. - continue Tylenol/ ***Motrin as needed for discomfort/discomfort - nasal saline to help with his nasal congestion - Use a mist humidifier to help with breathing - Stressed importance of hydration - Albuterol to help with chest tightness ***  - Zofran for nausea *** - Work/school note provided ***  - Refilled ***allergy medication/ Flonase - Discussed return and ED precautions, understanding voiced   Discussed MDM, treatment plan and plan for follow-up with patient/parent who agrees with plan.   Final Clinical Impressions(s) / UC Diagnoses   Final diagnoses:  Acute otalgia, right     Discharge  Instructions      Stop by the pharmacy to pick up your prescriptions.  Follow up with your primary care provider as needed.    ED Prescriptions     Medication Sig Dispense Auth. Provider   ciprofloxacin-dexamethasone (CIPRODEX) OTIC suspension Place 4 drops into the right ear 2 (two) times daily. For 7 days 7.5 mL Katha Cabal, DO      PDMP not reviewed this encounter.

## 2023-01-01 NOTE — ED Triage Notes (Signed)
Pt c/o foreign body in right ear.  Pt states that he was in [place] trimming bushes and felt something go in his right ear. Pt tried to put peroxide in his ear and states that something is still in there.

## 2023-01-19 ENCOUNTER — Ambulatory Visit
Admission: EM | Admit: 2023-01-19 | Discharge: 2023-01-19 | Disposition: A | Discharge status: Home / Self Care | Payer: 59 | Attending: Internal Medicine | Admitting: Internal Medicine

## 2023-01-19 DIAGNOSIS — Z1152 Encounter for screening for COVID-19: Secondary | ICD-10-CM | POA: Insufficient documentation

## 2023-01-19 DIAGNOSIS — X58XXXA Exposure to other specified factors, initial encounter: Secondary | ICD-10-CM | POA: Insufficient documentation

## 2023-01-19 DIAGNOSIS — S29011A Strain of muscle and tendon of front wall of thorax, initial encounter: Secondary | ICD-10-CM | POA: Insufficient documentation

## 2023-01-19 DIAGNOSIS — R0789 Other chest pain: Secondary | ICD-10-CM | POA: Insufficient documentation

## 2023-01-19 DIAGNOSIS — B349 Viral infection, unspecified: Secondary | ICD-10-CM

## 2023-01-19 LAB — SARS CORONAVIRUS 2 BY RT PCR: SARS Coronavirus 2 by RT PCR: NEGATIVE

## 2023-01-19 MED ORDER — IBUPROFEN 800 MG PO TABS
800.0000 mg | ORAL_TABLET | Freq: Once | ORAL | Status: AC
  Administered 2023-01-19: 800 mg via ORAL

## 2023-01-19 NOTE — ED Triage Notes (Signed)
Pt reports HA running down neck, across shoulders, nausea last night, decreased appetite, chest heaviness and burning, burning in lungs.  No meds taken. Chest/lung pain with inspiration.   No cough, vomiting, nasal congestion. Two home COVIDs were negative.

## 2023-01-19 NOTE — ED Triage Notes (Signed)
Pt does not want COVID test at today's visit d/t two home negatives.

## 2023-01-19 NOTE — ED Provider Notes (Signed)
[facility]    CSN: [id number] Arrival date & time: 01/19/23  1606      History   Chief Complaint Chief Complaint  Patient presents with   Generalized Body Aches   Chest Pain    HPI Mark Galvan is a 37 y.o. male.   37 year old male, Mark Galvan, presents to urgent care chief complaint of generalized bodyaches, chest pain with inspiration.  Patient states he feels like he was coming down with something over the weekend , has not taken anything for the discomfort ,no known illness exposure.  Patient denies any shortness of breath or  palpitations.  The history is provided by the patient. No language interpreter was used.    History reviewed. No pertinent past medical history.  Patient Active Problem List   Diagnosis Date Noted   Nonspecific syndrome suggestive of viral illness 01/19/2023    Past Surgical History:  Procedure Laterality Date   WISDOM TOOTH EXTRACTION         Home Medications    Prior to Admission medications   Medication Sig Start Date End Date Taking? Authorizing Provider  ciprofloxacin-dexamethasone (CIPRODEX) OTIC suspension Place 4 drops into the right ear 2 (two) times daily. For 7 days 01/01/23   Katha Cabal, DO  ibuprofen (ADVIL) 600 MG tablet Take 1 tablet (600 mg total) by mouth every 6 (six) hours as needed. 11/27/21   Domenick Gong, MD  lidocaine (LIDODERM) 5 % Place 1 patch onto the skin daily. Remove & Discard patch within 12 hours or as directed by MD 11/05/19   Lutricia Feil, PA-C  sildenafil (VIAGRA) 50 MG tablet Take 1-2 tablets by mouth daily as needed for erectile dysfunction. 02/23/19   [provider]  albuterol (PROVENTIL HFA;VENTOLIN HFA) 108 (90 Base) MCG/ACT inhaler Inhale 1-2 puffs into the lungs every 4 (four) hours as needed for wheezing or shortness of breath. 09/14/17 11/05/19  Domenick Gong, MD    Family History Family History  Problem Relation Age of Onset   Healthy Mother     Arrhythmia Father     Social History Social History   Tobacco Use   Smoking status: Never   Smokeless tobacco: Former    Types: Snuff  Vaping Use   Vaping Use: Never used  Substance Use Topics   Alcohol use: Yes    Comment: rarely   Drug use: No     Allergies   Penicillin g and Penicillins   Review of Systems Review of Systems  Constitutional:  Positive for fever.  Respiratory:  Positive for chest tightness. Negative for cough, shortness of breath, wheezing and stridor.   Cardiovascular:  Negative for palpitations.       Chest wall pain  Gastrointestinal:  Negative for nausea and vomiting.  Musculoskeletal:  Positive for myalgias.  All other systems reviewed and are negative.    Physical Exam Triage Vital Signs ED Triage Vitals  Enc Vitals Group     BP 01/19/23 1649 (!) 135/98     Pulse Rate 01/19/23 1649 (!) 119     Resp 01/19/23 1649 20     Temp 01/19/23 1649 99.2 F (37.3 C)     Temp Source 01/19/23 1649 Oral     SpO2 01/19/23 1649 100 %     Weight --      Height --      Head Circumference --      Peak Flow --      Pain Score 01/19/23 1646 7  Pain Loc --      Pain Edu? --      Excl. in GC? --    No data found.  Updated Vital Signs BP 124/82   Pulse (!) 106   Temp 98.6 F (37 C) (Oral)   Resp 18   SpO2 99%   Visual Acuity Right Eye Distance:   Left Eye Distance:   Bilateral Distance:    Right Eye Near:   Left Eye Near:    Bilateral Near:     Physical Exam Vitals and nursing note reviewed.  Constitutional:      General: He is not in acute distress.    Appearance: He is well-developed.  HENT:     Head: Normocephalic and atraumatic.  Eyes:     Conjunctiva/sclera: Conjunctivae normal.  Cardiovascular:     Rate and Rhythm: Regular rhythm. Tachycardia present.     Pulses: Normal pulses.     Heart sounds: Normal heart sounds. No murmur heard. Pulmonary:     Effort: Pulmonary effort is normal. No respiratory distress.     Breath  sounds: Normal breath sounds and air entry.  Abdominal:     Palpations: Abdomen is soft.     Tenderness: There is no abdominal tenderness.  Musculoskeletal:        General: No swelling.     Cervical back: Neck supple.  Skin:    General: Skin is warm and dry.     Capillary Refill: Capillary refill takes less than 2 seconds.  Neurological:     General: No focal deficit present.     Mental Status: He is alert and oriented to person, place, and time.     GCS: GCS eye subscore is 4. GCS verbal subscore is 5. GCS motor subscore is 6.  Psychiatric:        Attention and Perception: Attention normal.        Mood and Affect: Mood normal.        Speech: Speech normal.        Behavior: Behavior normal.      UC Treatments / Results  Labs (all labs ordered are listed, but only abnormal results are displayed) Labs Reviewed  SARS CORONAVIRUS 2 BY RT PCR    EKG   Radiology No results found.  Procedures Procedures (including critical care time)  Medications Ordered in UC Medications  ibuprofen (ADVIL) tablet 800 mg (800 mg Oral Given 01/19/23 1713)    Initial Impression / Assessment and Plan / UC Course  I have reviewed the triage vital signs and the nursing notes.  Pertinent labs & imaging results that were available during my care of the patient were reviewed by me and considered in my medical decision making (see chart for details).  Clinical Course as of 01/19/23 2152  Tue Jan 19, 2023  1716 Pt given ibuprofen for general body aches. Covid test ordered. EKG for chest tightness pain with inspiration shows ST rate 114,QTC is 407. Pt has not taken any meds for symptom management. [JD]    Clinical Course User Index [JD] Pratyush Ammon, Para March, NP  Patient reassessed after ibuprofen, vital signs improved, patient states he feels much better after medication.  Discussed plan of care patient verbalized understanding this provider.  Ddx: Viral URI, chest wall pain, musculoskeletal  strain Final Clinical Impressions(s) / UC Diagnoses   Final diagnoses:  Nonspecific syndrome suggestive of viral illness     Discharge Instructions      Most likely you have a viral illness:  no antibiotic as indicated at this time, May treat with OTC meds of choice(Dayquil/Nyquil/ibuprofen,etc).Make sure to drink plenty of fluids to stay hydrated(gatorade, water, popsicles,jello,etc), avoid caffeine products. Follow up with PCP. Return as needed.  If you continue to have chest tightness/pressure please go to ER for further evaluation.      ED Prescriptions   None    PDMP not reviewed this encounter.   Clancy Gourd, NP 01/19/23 2152

## 2023-01-19 NOTE — Discharge Instructions (Addendum)
Most likely you have a viral illness: no antibiotic as indicated at this time, May treat with OTC meds of choice(Dayquil/Nyquil/ibuprofen,etc).Make sure to drink plenty of fluids to stay hydrated(gatorade, water, popsicles,jello,etc), avoid caffeine products. Follow up with PCP. Return as needed.  If you continue to have chest tightness/pressure please go to ER for further evaluation.

## 2023-09-03 ENCOUNTER — Other Ambulatory Visit: Payer: Self-pay

## 2023-09-03 ENCOUNTER — Emergency Department
Admission: EM | Admit: 2023-09-03 | Discharge: 2023-09-03 | Disposition: A | Discharge status: Home / Self Care | Payer: Self-pay | Attending: Emergency Medicine | Admitting: Emergency Medicine

## 2023-09-03 DIAGNOSIS — A0811 Acute gastroenteropathy due to Norwalk agent: Secondary | ICD-10-CM | POA: Insufficient documentation

## 2023-09-03 MED ORDER — ONDANSETRON 4 MG PO TBDP: 4.0000 mg | ORAL_TABLET | Freq: Three times a day (TID) | ORAL | 0 refills | Status: DC | PRN

## 2023-09-03 NOTE — ED Triage Notes (Signed)
Pt comes with c/o diarrhea since yesterday and some cramping in stomach.

## 2023-09-03 NOTE — ED Provider Notes (Signed)
[facility] Provider Note    Event Date/Time   First MD Initiated Contact with Patient 09/03/23 [phone]     (approximate)   History   Diarrhea   HPI  Mark Galvan is a 38 y.o. male with no significant past medical history presents emergency department with diarrhea and not feeling well.  States everyone in his house has been having diarrhea.  His wife think she has norovirus.  She was tested for flu etc. and was negative.  Denies fever or chills.  No abdominal pain.  States basically he needs a work note      Physical Exam   Triage Vital Signs: ED Triage Vitals  Encounter Vitals Group     BP 09/03/23 0758 (!) 140/101     Systolic BP Percentile --      Diastolic BP Percentile --      Pulse Rate 09/03/23 0758 99     Resp 09/03/23 0758 18     Temp 09/03/23 0758 98 F (36.7 C)     Temp src --      SpO2 09/03/23 0758 100 %     Weight 09/03/23 0758 175 lb (79.4 kg)     Height 09/03/23 0758 5\' 10"  (1.778 m)     Head Circumference --      Peak Flow --      Pain Score 09/03/23 0757 2     Pain Loc --      Pain Education --      Exclude from Growth Chart --     Most recent vital signs: Vitals:   09/03/23 0758  BP: (!) 140/101  Pulse: 99  Resp: 18  Temp: 98 F (36.7 C)  SpO2: 100%     General: Awake, no distress.   CV:  Good peripheral perfusion. regular rate and  rhythm Resp:  Normal effort.  Abd:  No distention.  Nontender Other:      ED Results / Procedures / Treatments   Labs (all labs ordered are listed, but only abnormal results are displayed) Labs Reviewed - No data to display   EKG     RADIOLOGY     PROCEDURES:   Procedures   MEDICATIONS ORDERED IN ED: Medications - No data to display   IMPRESSION / MDM / ASSESSMENT AND PLAN / ED COURSE  I reviewed the triage vital signs and the nursing notes.                              Differential diagnosis includes, but is not limited to, norovirus, viral  illness, influenza, gastroenteritis  Patient's presentation is most consistent with acute illness / injury with system symptoms.   Due to the outbreak of norovirus in the area feel patient mainly has norovirus especially since his family is also sick.  Explained to him that conservative measures are appropriate this time.  Begins to have nausea and vomiting he can use Zofran ODT.  He should let the diarrhea run its course of the virus that shedding through his GI tract.  States he understands.  He is stay hydrated by drinking fluids.  Get was given a work note stating he can go back to work when he has not had diarrhea for 24 to 48 hours as he is contagious.  Discharged in stable condition.  Patient is in agreement with treatment plan.      FINAL CLINICAL IMPRESSION(S) / ED DIAGNOSES  Final diagnoses:  Norovirus     Rx / DC Orders   ED Discharge Orders          Ordered    ondansetron (ZOFRAN-ODT) 4 MG disintegrating tablet  Every 8 hours PRN        09/03/23 0800             Note:  This document was prepared using Dragon voice recognition software and may include unintentional dictation errors.    Faythe Ghee, PA-C 09/03/23 Lisbeth Renshaw    Jene Every, MD 09/03/23 [phone]

## 2023-12-21 ENCOUNTER — Other Ambulatory Visit: Payer: Self-pay | Admitting: Internal Medicine

## 2023-12-21 DIAGNOSIS — R200 Anesthesia of skin: Secondary | ICD-10-CM

## 2023-12-21 DIAGNOSIS — M79605 Pain in left leg: Secondary | ICD-10-CM

## 2023-12-28 ENCOUNTER — Ambulatory Visit
Admission: RE | Admit: 2023-12-28 | Discharge: 2023-12-28 | Disposition: A | Discharge status: Home / Self Care | Source: Ambulatory Visit | Attending: Internal Medicine | Admitting: Internal Medicine

## 2023-12-28 DIAGNOSIS — M79605 Pain in left leg: Secondary | ICD-10-CM

## 2023-12-28 DIAGNOSIS — R200 Anesthesia of skin: Secondary | ICD-10-CM

## 2024-02-25 ENCOUNTER — Ambulatory Visit (INDEPENDENT_AMBULATORY_CARE_PROVIDER_SITE_OTHER)

## 2024-02-25 ENCOUNTER — Ambulatory Visit: Admission: EM | Admit: 2024-02-25 | Discharge: 2024-02-25 | Disposition: A | Discharge status: Home / Self Care

## 2024-02-25 DIAGNOSIS — S8012XA Contusion of left lower leg, initial encounter: Secondary | ICD-10-CM | POA: Diagnosis not present

## 2024-02-25 DIAGNOSIS — M79662 Pain in left lower leg: Secondary | ICD-10-CM

## 2024-02-25 NOTE — Discharge Instructions (Addendum)
 Your x-rays did not show any broken bones.  Judging by the mechanism of injury you most likely bruised the soft tissue and possibly the bone underneath.  This can take a number of weeks to heal.  You may apply ice to your shin for 20 to the time, 2-3 times a day, to help with pain and inflammation.  Make sure you have a cloth between the ice and your skin so as to not cause skin damage.  You may use over-the-counter Tylenol  and/or ibuprofen  according to package instructions as needed for pain.  If your symptoms do not improve I would recommend following up with orthopedics such as [facility] here in [place] or in [place].

## 2024-02-25 NOTE — ED Provider Notes (Signed)
 [facility]    CSN: [id number] Arrival date & time: 02/25/24  1849      History   Chief Complaint Chief Complaint  Patient presents with   Leg Injury    HPI Shamir Konner Saiz is a 38 y.o. male.   HPI  38 year old male with no significant past medical history presents for evaluation of pain in his left lower leg that has been going on for last 2 to 3 weeks.  He reports that he was working with a pressurized hose when a brass fitting broke and the hose flew back towards him impacting his shin with the broken fitting.  He reports that he has been experiencing pain with weightbearing and that there is a palpable knot in his shin.  He has ongoing numbness in both of his legs and feet and has not noticed any increase.  History reviewed. No pertinent past medical history.  Patient Active Problem List   Diagnosis Date Noted   Nonspecific syndrome suggestive of viral illness 01/19/2023    Past Surgical History:  Procedure Laterality Date   WISDOM TOOTH EXTRACTION         Home Medications    Prior to Admission medications   Medication Sig Start Date End Date Taking? Authorizing Provider  diclofenac (VOLTAREN) 75 MG EC tablet Take 75 mg twice daily for 1 month, with food then decrease to 75 mg once daily for 1 month with food then stop taking. 02/22/24  Yes [provider]  gabapentin (NEURONTIN) 100 MG capsule Take 200 mg nightly for 1 week, then, increase 300 mg nightly for numbness, tingling, and nerve pain 02/22/24  Yes [provider]  ibuprofen  (ADVIL ) 600 MG tablet Take 1 tablet (600 mg total) by mouth every 6 (six) hours as needed. 11/27/21  Yes Van Knee, MD  ciprofloxacin -dexamethasone  (CIPRODEX ) OTIC suspension Place 4 drops into the right ear 2 (two) times daily. For 7 days 01/01/23   Brimage, Vondra, DO  lidocaine  (LIDODERM ) 5 % Place 1 patch onto the skin daily. Remove & Discard patch within 12 hours or as directed by MD 11/05/19    Lacinda Elsie SQUIBB, PA-C  ondansetron  (ZOFRAN -ODT) 4 MG disintegrating tablet Take 1 tablet (4 mg total) by mouth every 8 (eight) hours as needed. 09/03/23   Fisher, Devere ORN, PA-C  sildenafil (VIAGRA) 50 MG tablet Take 1-2 tablets by mouth daily as needed for erectile dysfunction. 02/23/19   [provider]  albuterol  (PROVENTIL  HFA;VENTOLIN  HFA) 108 (90 Base) MCG/ACT inhaler Inhale 1-2 puffs into the lungs every 4 (four) hours as needed for wheezing or shortness of breath. 09/14/17 11/05/19  Van Knee, MD    Family History Family History  Problem Relation Age of Onset   Healthy Mother    Arrhythmia Father     Social History Social History   Tobacco Use   Smoking status: Never   Smokeless tobacco: Former    Types: Snuff  Vaping Use   Vaping status: Never Used  Substance Use Topics   Alcohol use: Yes    Comment: rarely   Drug use: No     Allergies   Penicillin g and Penicillins   Review of Systems Review of Systems  Skin:  Positive for color change and wound.     Physical Exam Triage Vital Signs ED Triage Vitals  Encounter Vitals Group     BP 02/25/24 1907 (!) 160/89     Girls Systolic BP Percentile --      Girls  Diastolic BP Percentile --      Boys Systolic BP Percentile --      Boys Diastolic BP Percentile --      Pulse Rate 02/25/24 1907 93     Resp --      Temp 02/25/24 1907 98.5 F (36.9 C)     Temp Source 02/25/24 1907 Oral     SpO2 02/25/24 1907 97 %     Weight 02/25/24 1904 175 lb (79.4 kg)     Height 02/25/24 1904 5' 10 (1.778 m)     Head Circumference --      Peak Flow --      Pain Score 02/25/24 1904 10     Pain Loc --      Pain Education --      Exclude from Growth Chart --    No data found.  Updated Vital Signs BP (!) 160/89 (BP Location: Left Arm)   Pulse 93   Temp 98.5 F (36.9 C) (Oral)   Ht 5' 10 (1.778 m)   Wt 175 lb (79.4 kg)   SpO2 97%   BMI 25.11 kg/m   Visual Acuity Right Eye Distance:   Left Eye  Distance:   Bilateral Distance:    Right Eye Near:   Left Eye Near:    Bilateral Near:     Physical Exam Vitals and nursing note reviewed.  Constitutional:      Appearance: Normal appearance. He is not ill-appearing.  HENT:     Head: Normocephalic and atraumatic.  Musculoskeletal:        General: Tenderness and signs of injury present. No swelling.  Skin:    General: Skin is warm and dry.     Capillary Refill: Capillary refill takes less than 2 seconds.     Findings: No bruising or erythema.  Neurological:     General: No focal deficit present.     Mental Status: He is alert and oriented to person, place, and time.      UC Treatments / Results  Labs (all labs ordered are listed, but only abnormal results are displayed) Labs Reviewed - No data to display  EKG   Radiology DG Tibia/Fibula Left Result Date: 02/25/2024 CLINICAL DATA:  Pain in distal shin status post brass fitting impact 2 to 3 weeks ago. EXAM: LEFT TIBIA AND FIBULA - 2 VIEW COMPARISON:  None Available. FINDINGS: No acute fracture or dislocation. There is no evidence of arthropathy or other focal bone abnormality. Soft tissues are unremarkable. No radiopaque foreign body. IMPRESSION: No acute fracture or dislocation. Electronically Signed   By: Rogelia Myers M.D.   On: 02/25/2024 19:30    Procedures Procedures (including critical care time)  Medications Ordered in UC Medications - No data to display  Initial Impression / Assessment and Plan / UC Course  I have reviewed the triage vital signs and the nursing notes.  Pertinent labs & imaging results that were available during my care of the patient were reviewed by me and considered in my medical decision making (see chart for details).   Patient is a nontoxic-appearing 38 year old male presenting for evaluation of pain in the distal aspect of the anterior shin on the left.  He reports that he was struck by a brass fitting on a hose that was pressurized 2  to 3 weeks ago.  He endorses pain with weightbearing as well as ongoing numbness in his left leg and foot.  The numbness has been present for years and he  is currently being evaluated for that.  He has not noticed any increase in symptoms.  As you can see the above, there is a scabbed lesion surrounded by some mild erythema on the distal medial aspect of the anterior left shin.  There is a small palpable knot underneath this area of erythema.  His DP and PT pulses are 2+.  Cap refill is less than 3 seconds.  The patient reports pain runs down the anterior aspect of his shin.  I suspect he has a contusion of his periosteum.  I will obtain a radiograph of the left tib-fib to evaluate for any bony abnormality.  Left tib-fib films independently reviewed and evaluated by me.  Impression: No evidence of fracture or dislocation.  No appreciable soft tissue swelling and no retained foreign body.  Radiology read is pending. Radiology impression states no fracture or dislocation.  I will discharge patient home with diagnosis of contusion of his left shin.  He can apply ice to his shin for 20 minutes at a time, 2-3 times a day, do not with pain and inflammation.  You may also use over-the-counter Tylenol  and or ibuprofen  according the package instructions as needed for pain.   Final Clinical Impressions(s) / UC Diagnoses   Final diagnoses:  Pain in left shin  Contusion of left lower extremity, initial encounter     Discharge Instructions      Your x-rays did not show any broken bones.  Judging by the mechanism of injury you most likely bruised the soft tissue and possibly the bone underneath.  This can take a number of weeks to heal.  You may apply ice to your shin for 20 to the time, 2-3 times a day, to help with pain and inflammation.  Make sure you have a cloth between the ice and your skin so as to not cause skin damage.  You may use over-the-counter Tylenol  and/or ibuprofen  according to package  instructions as needed for pain.  If your symptoms do not improve I would recommend following up with orthopedics such as [facility] here in [place] or in [place].     ED Prescriptions   None    PDMP not reviewed this encounter.   Bernardino Ditch, NP 02/25/24 1934

## 2024-02-25 NOTE — ED Triage Notes (Signed)
 Pt c/o left leg injury x2weeks  Pt states that he had a sciatica issue starting 4 years ago and has had continuous left leg and foot numbness.  Pt states that 2 weeks ago he was hit in the shin by a pressurized carpet cleaning hose and he doesn't know if he broke his leg.   Pt has bilateral ankle swelling

## 2024-03-08 ENCOUNTER — Ambulatory Visit (INDEPENDENT_AMBULATORY_CARE_PROVIDER_SITE_OTHER)

## 2024-03-08 ENCOUNTER — Ambulatory Visit
Admission: EM | Admit: 2024-03-08 | Discharge: 2024-03-08 | Disposition: A | Discharge status: Home / Self Care | Attending: Family Medicine | Admitting: Family Medicine

## 2024-03-08 ENCOUNTER — Encounter: Payer: Self-pay | Admitting: Emergency Medicine

## 2024-03-08 DIAGNOSIS — M25571 Pain in right ankle and joints of right foot: Secondary | ICD-10-CM

## 2024-03-08 MED ORDER — NAPROXEN 500 MG PO TABS: 500.0000 mg | ORAL_TABLET | Freq: Two times a day (BID) | ORAL | 0 refills | Status: AC

## 2024-03-08 MED ORDER — LIDOCAINE 5 % EX PTCH: 1.0000 | MEDICATED_PATCH | CUTANEOUS | 0 refills | Status: AC

## 2024-03-08 NOTE — Discharge Instructions (Addendum)
 If medication was prescribed, stop by the pharmacy to pick up your prescriptions.  For your  pain, Take 1500 mg Tylenol  twice a day and continue taking as needed for pain. Wear your air cast with activity. Rest and elevate the affected painful area.  Apply cold compresses intermittently, as needed.  As pain recedes, begin normal activities slowly as tolerated.  Follow up with primary care provider or an orthopedic provider, if symptoms persist.

## 2024-03-08 NOTE — ED Triage Notes (Signed)
 Pt presents c/o right ankle injury x 1 day. Pt reports he rolled it today about 2 hours ago.

## 2024-03-08 NOTE — ED Provider Notes (Addendum)
 [facility]    CSN: [id number] Arrival date & time: 03/08/24  1244      History   Chief Complaint Chief Complaint  Patient presents with   Ankle Injury    HPI  HPI Mark Galvan is a 38 y.o. male.   Mark Galvan presents for right ankle pain after stepping out of the [place] today.  He stepped on the ground and fell after rolling his ankle. He heard a noise. Thinks he broke his ankle. No previous injury.  Pain worse with walking.   Took some inflammation medication.  Pain rated 8/10 and is constant.     History reviewed. No pertinent past medical history.  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 Authorizing Provider  cyanocobalamin (VITAMIN B12) 1000 MCG tablet Take 1,000 mcg by mouth. 12/20/23 12/19/24 Yes [provider]  naproxen  (NAPROSYN ) 500 MG tablet Take 1 tablet (500 mg total) by mouth 2 (two) times daily with a meal. 03/08/24  Yes Denelle Capurro, DO  Vitamin D, Ergocalciferol, (DRISDOL) 1.25 MG (50000 UNIT) CAPS capsule Take 50,000 Units by mouth once a week. 01/27/24  Yes [provider]  diclofenac (VOLTAREN) 75 MG EC tablet Take 75 mg twice daily for 1 month, with food then decrease to 75 mg once daily for 1 month with food then stop taking. 02/22/24   [provider]  gabapentin (NEURONTIN) 100 MG capsule Take 200 mg nightly for 1 week, then, increase 300 mg nightly for numbness, tingling, and nerve pain 02/22/24   [provider]  lidocaine  (LIDODERM ) 5 % Place 1 patch onto the skin daily. Remove & Discard patch within 12 hours or as directed by MD 03/08/24   Kynli Chou, DO  sildenafil (VIAGRA) 50 MG tablet Take 1-2 tablets by mouth daily as needed for erectile dysfunction. 02/23/19   [provider]  albuterol  (PROVENTIL  HFA;VENTOLIN  HFA) 108 (90 Base) MCG/ACT inhaler Inhale 1-2 puffs into the lungs every 4 (four) hours as needed for wheezing or shortness of breath. 09/14/17 11/05/19  Van Knee, MD    Family History Family History  Problem Relation Age of Onset   Healthy Mother    Arrhythmia Father     Social History Social History   Tobacco Use   Smoking status: Never    Passive exposure: Never   Smokeless tobacco: Former    Types: Snuff  Vaping Use   Vaping status: Never Used  Substance Use Topics   Alcohol use: Yes    Comment: rarely   Drug use: No     Allergies   Penicillin g and Penicillins   Review of Systems Review of Systems: :negative unless otherwise stated in HPI.      Physical Exam Triage Vital Signs ED Triage Vitals  Encounter Vitals Group     BP 03/08/24 1303 (!) 140/92     Girls Systolic BP Percentile --      Girls Diastolic BP Percentile --  Boys Systolic BP Percentile --      Boys Diastolic BP Percentile --      Pulse Rate 03/08/24 1303 97     Resp 03/08/24 1303 18     Temp 03/08/24 1303 98 F (36.7 C)     Temp Source 03/08/24 1303 Oral     SpO2 03/08/24 1303 100 %     Weight 03/08/24 1301 175 lb 0.7 oz (79.4 kg)     Height --      Head Circumference --      Peak Flow --      Pain Score 03/08/24 1301 9     Pain Loc --      Pain Education --      Exclude from Growth Chart --    No data found.  Updated Vital Signs BP (!) 140/92 (BP Location: Right Arm)   Pulse 97   Temp 98 F (36.7 C) (Oral)   Resp 18   Wt 79.4 kg   SpO2 100%   BMI 25.12 kg/m   Visual Acuity Right Eye Distance:   Left Eye Distance:   Bilateral  Distance:    Right Eye Near:   Left Eye Near:    Bilateral Near:     Physical Exam GEN: well appearing male in no acute distress  CVS: well perfused  RESP: speaking in full sentences without pause, no respiratory distress  MSK:   Ankle/Foot, Right: TTP noted at the lateral malleolus with mild ankle edema. No visible erythema, ecchymosis, or bony deformity. No notable pes planus/cavus deformity. Transverse arch grossly intact; No evidence of tibiotalar deviation; Range of motion is full in all directions. Strength is 5/5 in all directions. No tenderness at the insertion/body/myotendinous junction of the Achilles tendon; No peroneal tendon tenderness or subluxation; No tenderness on posterior aspects of  medial malleolus; Stable lateral and medial ligaments; Unremarkable squeeze and kleiger tests; Talar dome nontender; Unremarkable calcaneal squeeze; No plantar calcaneal tenderness; No tenderness over the navicular prominence; No tenderness over cuboid; No pain at base of 5th MT; No tenderness at the distal metatarsals; Negative tarsal tunnel tinel's; Able to walk 4 steps though with pain     UC Treatments / Results  Labs (all labs ordered are listed, but only abnormal results are displayed) Labs Reviewed - No data to display  EKG   Radiology DG Ankle Complete Right Result Date: 03/08/2024 CLINICAL DATA:  Rolling injury with pain. EXAM: RIGHT ANKLE - COMPLETE 3+ VIEW COMPARISON:  None Available. FINDINGS: Mild soft tissue swelling over the lateral malleolus. No acute osseous or joint abnormality. IMPRESSION: Mild soft tissue swelling over the lateral malleolus. No fracture or joint abnormality. Electronically Signed   By: Newell Eke M.D.   On: 03/08/2024 13:22     Procedures Procedures (including critical care time)  Medications Ordered in UC Medications - No data to display  Initial Impression / Assessment and Plan / UC Course  I have reviewed the triage vital signs and the  nursing notes.  Pertinent labs & imaging results that were available during my care of the patient were reviewed by me and considered in my medical decision making (see chart for details).      Pt is a 38 y.o.  male with acute onset right ankle pain after falling out of a van today.   On exam, pt has tenderness at lateral malleolus  concerning for fracture.   Obtained right ankle plain films.  Personally interpreted by me were unremarkable  for fracture or dislocation. Radiologist report reviewed and additionally notes mild  soft tissue swelling over lateral malleolus.  Given right ankle air cast for support.   Patient to gradually return to normal activities, as tolerated and continue ordinary activities within the limits permitted by pain. Start Lidocaine  patches and Naproxen  for pain. Continue home gabapentin.  Tylenol  PRN. Advised patient to avoid OTC NSAIDs while taking prescription NSAID.   Patient to follow up with orthopedic provider, if symptoms do not improve with conservative treatment.  Return and ED precautions given. Understanding voiced. Discussed MDM, treatment plan and plan for follow-up with patient who agrees with plan.   Final Clinical Impressions(s) / UC Diagnoses   Final diagnoses:  Acute right ankle pain     Discharge Instructions      If medication was prescribed, stop by the pharmacy to pick up your prescriptions.  For your  pain, Take 1500 mg Tylenol  twice a day and continue taking as needed for pain. Wear your air cast with activity. Rest and elevate the affected painful area.  Apply cold compresses intermittently, as needed.  As pain recedes, begin normal activities slowly as tolerated.  Follow up with primary care provider or an orthopedic provider, if symptoms persist.       ED Prescriptions     Medication Sig Dispense Auth. Provider   naproxen  (NAPROSYN ) 500 MG tablet Take 1 tablet (500 mg total) by mouth 2 (two) times daily with a meal. 30 tablet  Torez Beauregard, DO   lidocaine  (LIDODERM ) 5 % Place 1 patch onto the skin daily. Remove & Discard patch within 12 hours or as directed by MD 6 patch Gershom Brobeck, DO      I have reviewed the PDMP during this encounter.      Keveon Amsler, DO 03/08/24 1503

## 2024-04-14 NOTE — Progress Notes (Deleted)
 Referring Physician:  No referring provider defined for this encounter.  Primary Physician:  Pcp, No  History of Present Illness: 04/14/2024 [name] is here today with a chief complaint of ***  Low back pain that radiates down the left leg causing tightness. Any weakness, numbness or tingling?     Duration: *** Location: *** Quality: *** Severity: ***  Precipitating: aggravated by *** Modifying factors: made better by *** Weakness: none Timing: *** Bowel/Bladder Dysfunction: none  Conservative measures:  Physical therapy: *** Is currently in PT at [facility] Multimodal medical therapy including regular antiinflammatories: *** Diclofenac, Gabapentin, Naproxen  Injections: no epidural steroid injections  Past Surgery: ***none  Mark Galvan has ***no symptoms of cervical myelopathy.  The symptoms are causing a significant impact on the patient's life.   I have utilized the care everywhere function in epic to review the outside records available from external health systems.   Review of Systems:  A 10 point review of systems is negative, except for the pertinent positives and negatives detailed in the HPI.  Past Medical History: No past medical history on file.  Past Surgical History: Past Surgical History:  Procedure Laterality Date   WISDOM TOOTH EXTRACTION      Allergies: Allergies as of 04/20/2024 - Review Complete 03/08/2024  Allergen Reaction Noted   Penicillin g  03/18/2015   Penicillins Other (See Comments) 02/04/2015    Medications:  Current Outpatient Medications:    cyanocobalamin (VITAMIN B12) 1000 MCG tablet, Take 1,000 mcg by mouth., Disp: , Rfl:    diclofenac (VOLTAREN) 75 MG EC tablet, Take 75 mg twice daily for 1 month, with food then decrease to 75 mg once daily for 1 month with food then stop taking., Disp: , Rfl:    gabapentin (NEURONTIN) 100 MG capsule, Take 200 mg nightly for 1 week, then, increase 300 mg nightly for  numbness, tingling, and nerve pain, Disp: , Rfl:    lidocaine  (LIDODERM ) 5 %, Place 1 patch onto the skin daily. Remove & Discard patch within 12 hours or as directed by MD, Disp: 6 patch, Rfl: 0   naproxen  (NAPROSYN ) 500 MG tablet, Take 1 tablet (500 mg total) by mouth 2 (two) times daily with a meal., Disp: 30 tablet, Rfl: 0   sildenafil (VIAGRA) 50 MG tablet, Take 1-2 tablets by mouth daily as needed for erectile dysfunction., Disp: , Rfl:    Vitamin D, Ergocalciferol, (DRISDOL) 1.25 MG (50000 UNIT) CAPS capsule, Take 50,000 Units by mouth once a week., Disp: , Rfl:   Social History: Social History   Tobacco Use   Smoking status: Never    Passive exposure: Never   Smokeless tobacco: Former    Types: Snuff  Vaping Use   Vaping status: Never Used  Substance Use Topics   Alcohol use: Yes    Comment: rarely   Drug use: No    Family Medical History: Family History  Problem Relation Age of Onset   Healthy Mother    Arrhythmia Father     Physical Examination: There were no vitals filed for this visit.  General: Patient is in no apparent distress. Attention to examination is appropriate.  Neck:   Supple.  Full range of motion.  Respiratory: Patient is breathing without any difficulty.   NEUROLOGICAL:     Awake, alert, oriented to person, place, and time.  Speech is clear and fluent.   Cranial Nerves: Pupils equal round and reactive to light.  Facial tone is symmetric.  Facial sensation is symmetric. Shoulder shrug is symmetric. Tongue protrusion is midline.  There is no pronator drift.  Strength: Side Biceps Triceps Deltoid Interossei Grip Wrist Ext. Wrist Flex.  R 5 5 5 5 5 5 5   L 5 5 5 5 5 5 5    Side Iliopsoas Quads Hamstring PF DF EHL  R 5 5 5 5 5 5   L 5 5 5 5 5 5    Reflexes are ***2+ and symmetric at the biceps, triceps, brachioradialis, patella and achilles.   Hoffman's is absent.   Bilateral upper and lower extremity sensation is intact to light touch.    No  evidence of dysmetria noted.  Gait is normal.     Medical Decision Making  Imaging: ***  I have personally reviewed the images and agree with the above interpretation.  Assessment and Plan: [name] is a pleasant 38 y.o. male with ***      Thank you for involving me in the care of this patient.      Chester K. Clois MD, [facility] Neurosurgery

## 2024-04-20 ENCOUNTER — Ambulatory Visit: Admitting: Neurosurgery

## 2024-08-06 ENCOUNTER — Ambulatory Visit
Admission: EM | Admit: 2024-08-06 | Discharge: 2024-08-06 | Disposition: A | Discharge status: Home / Self Care | Attending: Physician Assistant | Admitting: Physician Assistant

## 2024-08-06 ENCOUNTER — Encounter: Payer: Self-pay | Admitting: Emergency Medicine

## 2024-08-06 DIAGNOSIS — J029 Acute pharyngitis, unspecified: Secondary | ICD-10-CM | POA: Insufficient documentation

## 2024-08-06 LAB — POCT RAPID STREP A (OFFICE): Rapid Strep A Screen: NEGATIVE

## 2024-08-06 NOTE — Discharge Instructions (Signed)
 Your strep was negative in clinic.  We will contact you if your culture is positive you will need to start an antibiotic.  Gargle with warm salt water 3-4 times a day.  Alternate Tylenol  and ibuprofen  for pain.  I am also concerned that your symptoms are related to postnasal drainage from allergies.  Start fluticasone nasal spray as well as cetirizine daily.  Both of these medications are available over-the-counter.  If you are not feeling better within a few days please return for reevaluation.  If anything worsens you have difficulty swallowing, swelling of your throat, shortness of breath, high fever you should be seen immediately.

## 2024-08-06 NOTE — ED Provider Notes (Signed)
 " [facility]    CSN: [id number] Arrival date & time: 08/06/24  9050      History   Chief Complaint Chief Complaint  Patient presents with   Otalgia    left   Sore Throat    HPI Mark Galvan is a 38 y.o. male.   Patient presents today with a 6-day history of sore throat.  He reports that the sore throat is improving but continues to bother him prompting evaluation.  He reports that pain is rated 5 on a 0-10 pain scale, described as aching, worse with swallowing, no alleviating factors identified.  He denies any fever, cough, congestion, nausea, vomiting.  Denies any dysphagia, swelling of his throat, shortness of breath, muffled voice.  Does report that he has grandchildren that live with him who have been sick with similar symptoms.  He has not been taking any over-the-counter medications.  Denies any recent antibiotics or steroids.  He denies formal diagnosis of allergies but does occasionally have allergy symptoms during season changes.  He has missed work as a result of symptoms and is requesting a work excuse note.    History reviewed. No pertinent past medical history.  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 Authorizing Provider  cyanocobalamin (VITAMIN B12) 1000 MCG tablet Take 1,000 mcg by mouth. 12/20/23 12/19/24 Yes [provider]  lidocaine  (LIDODERM ) 5 % Place 1 patch onto the skin daily. Remove & Discard patch within 12 hours or as directed  by MD 03/08/24   Brimage, Vondra, DO  naproxen  (NAPROSYN ) 500 MG tablet Take 1 tablet (500 mg total) by mouth 2 (two) times daily with a meal. 03/08/24   Brimage, Caprice, DO  albuterol  (PROVENTIL  HFA;VENTOLIN  HFA) 108 (90 Base) MCG/ACT inhaler Inhale 1-2 puffs into the lungs every 4 (four) hours as needed for wheezing or shortness of breath. 09/14/17 11/05/19  Van Knee, MD    Family History Family History  Problem Relation Age of Onset   Healthy Mother    Arrhythmia Father     Social History Social History[1]   Allergies   Penicillin g and Penicillins   Review of Systems Review of Systems  Constitutional:  Negative for activity change, appetite change, fatigue and fever.  HENT:  Positive for sore throat. Negative for congestion, sinus pressure, sneezing, trouble swallowing and voice change.   Respiratory:  Negative for cough and shortness of breath.   Cardiovascular:  Negative for chest pain.  Gastrointestinal:  Negative for abdominal pain, diarrhea, nausea and vomiting.  Neurological:  Negative for headaches.     Physical Exam Triage Vital Signs ED Triage Vitals  Encounter Vitals Group     BP 08/06/24 1141 126/87     Girls Systolic BP Percentile --      Girls Diastolic BP Percentile --      Boys Systolic BP Percentile --      Boys Diastolic BP Percentile --      Pulse Rate 08/06/24 1141 92  Resp 08/06/24 1141 16     Temp 08/06/24 1141 98 F (36.7 C)     Temp Source 08/06/24 1141 Oral     SpO2 08/06/24 1141 98 %     Weight 08/06/24 1140 175 lb 0.7 oz (79.4 kg)     Height 08/06/24 1140 5' 10 (1.778 m)     Head Circumference --      Peak Flow --      Pain Score 08/06/24 1140 5     Pain Loc --      Pain Education --      Exclude from Growth Chart --    No data found.  Updated Vital Signs BP 126/87 (BP Location: Right Arm)   Pulse 92   Temp 98 F (36.7 C) (Oral)   Resp 16   Ht 5' 10 (1.778 m)   Wt 175 lb 0.7 oz (79.4 kg)   SpO2 98%   BMI 25.12  kg/m   Visual Acuity Right Eye Distance:   Left Eye Distance:   Bilateral Distance:    Right Eye Near:   Left Eye Near:    Bilateral Near:     Physical Exam Vitals reviewed.  Constitutional:      General: He is awake.     Appearance: Normal appearance. He is well-developed. He is not ill-appearing.     Comments: Very pleasant male appears stated age in no acute distress sitting comfortably in exam room  HENT:     Head: Normocephalic and atraumatic.     Right Ear: Ear canal and external ear normal. Tympanic membrane is retracted. Tympanic membrane is not erythematous or bulging.     Left Ear: Ear canal and external ear normal. Tympanic membrane is retracted. Tympanic membrane is not erythematous or bulging.     Nose: Nose normal.     Mouth/Throat:     Pharynx: Uvula midline. Posterior oropharyngeal erythema and postnasal drip present. No oropharyngeal exudate or uvula swelling.  Cardiovascular:     Rate and Rhythm: Normal rate and regular rhythm.     Heart sounds: Normal heart sounds, S1 normal and S2 normal. No murmur heard. Pulmonary:     Effort: Pulmonary effort is normal. No accessory muscle usage or respiratory distress.     Breath sounds: Normal breath sounds. No stridor. No wheezing, rhonchi or rales.     Comments: Clear to auscultation bilaterally Neurological:     Mental Status: He is alert.  Psychiatric:        Behavior: Behavior is cooperative.      UC Treatments / Results  Labs (all labs ordered are listed, but only abnormal results are displayed) Labs Reviewed  POCT RAPID STREP A (OFFICE) - Normal  CULTURE, GROUP A STREP [facility])    EKG   Radiology No results found.  Procedures Procedures (including critical care time)  Medications Ordered in UC Medications - No data to display  Initial Impression / Assessment and Plan / UC Course  I have reviewed the triage vital signs and the nursing notes.  Pertinent labs & imaging results that were  available during my care of the patient were reviewed by me and considered in my medical decision making (see chart for details).     Patient is well-appearing, afebrile, nontoxic, nontachycardic.  No evidence of acute infection on physical exam that warrant initiation of antibiotics.  Strep testing was obtained and was negative.  Will send for culture but defer antibiotics until culture results are available.  I  suspect symptoms are related to postnasal drainage from seasonal allergies versus viral pharyngitis.  Recommended conservative treatment measures including gargling with warm salt water, Tylenol  and ibuprofen  for pain, fluticasone nasal spray and cetirizine to help with congestion.  We discussed that if his symptoms are not improving within a few days he is to return for reevaluation.  Strict return precautions given.  Excuse note provided.  All questions answered to patient satisfaction.  Final Clinical Impressions(s) / UC Diagnoses   Final diagnoses:  Sore throat  Viral pharyngitis     Discharge Instructions      Your strep was negative in clinic.  We will contact you if your culture is positive you will need to start an antibiotic.  Gargle with warm salt water 3-4 times a day.  Alternate Tylenol  and ibuprofen  for pain.  I am also concerned that your symptoms are related to postnasal drainage from allergies.  Start fluticasone nasal spray as well as cetirizine daily.  Both of these medications are available over-the-counter.  If you are not feeling better within a few days please return for reevaluation.  If anything worsens you have difficulty swallowing, swelling of your throat, shortness of breath, high fever you should be seen immediately.     ED Prescriptions   None    PDMP not reviewed this encounter.    [1]  Social History Tobacco Use   Smoking status: Never    Passive exposure: Never   Smokeless tobacco: Former    Types: Snuff  Vaping Use   Vaping status: Never  Used  Substance Use Topics   Alcohol use: Yes    Comment: rarely   Drug use: No     Sherrell Rocky POUR, PA-C 08/06/24 1215  "

## 2024-08-06 NOTE — ED Triage Notes (Signed)
 Pt c/o left ear pain and sore throat. Started about 6 days ago. Denies fever. Pt states he needs a note for work.

## 2024-08-08 LAB — CULTURE, GROUP A STREP (THRC)

## 2024-08-09 ENCOUNTER — Ambulatory Visit (HOSPITAL_COMMUNITY): Payer: Self-pay

## 2024-08-09 MED ORDER — AZITHROMYCIN 250 MG PO TABS: ORAL_TABLET | ORAL | 0 refills | Status: AC

## 2024-09-04 ENCOUNTER — Ambulatory Visit
Admission: EM | Admit: 2024-09-04 | Discharge: 2024-09-04 | Disposition: A | Discharge status: Home / Self Care | Attending: Family Medicine | Admitting: Family Medicine

## 2024-09-04 DIAGNOSIS — J029 Acute pharyngitis, unspecified: Secondary | ICD-10-CM | POA: Insufficient documentation

## 2024-09-04 DIAGNOSIS — J020 Streptococcal pharyngitis: Secondary | ICD-10-CM | POA: Diagnosis not present

## 2024-09-04 LAB — POCT RAPID STREP A (OFFICE): Rapid Strep A Screen: NEGATIVE

## 2024-09-04 MED ORDER — LIDOCAINE VISCOUS HCL 2 % MT SOLN: 15.0000 mL | OROMUCOSAL | 0 refills | Status: AC | PRN

## 2024-09-04 MED ORDER — AZITHROMYCIN 250 MG PO TABS: ORAL_TABLET | ORAL | 0 refills | Status: AC

## 2024-09-04 NOTE — Discharge Instructions (Addendum)
 Your strep is negative here but I will send your throat culture for verification as it was only seen of your throat culture the last time.    You can take Tylenol  and/or Ibuprofen  as needed for fever reduction and pain relief.     For sore throat: try warm salt water gargles, Mucinex sore throat cough drops or cepacol lozenges, throat spray, warm tea or water with lemon/honey, popsicles or ice, or OTC cold relief medicine for throat discomfort. You can also purchase chloraseptic spray at the pharmacy or dollar store.    It is important to stay hydrated: drink plenty of fluids (water, gatorade/powerade/pedialyte, juices, or teas) to keep your throat moisturized and help further relieve irritation/discomfort.    Return or go to the Emergency Department if symptoms worsen or do not improve in the next few days

## 2024-09-04 NOTE — ED Triage Notes (Signed)
 Pt c/o sore throat x3 wks. Was seen on 12/21 for the same issue. Tested neg for strep but cx came back +. Had Z pack sent but only took 1 dose d/t losing it.  Was seen again on 1/5 for the same issue & was given AMX for 10 days. Has 1 dose left but has been skipping doses.

## 2024-09-04 NOTE — ED Provider Notes (Signed)
 " [facility]    CSN: [id number] Arrival date & time: 09/04/24  1443      History   Chief Complaint Chief Complaint  Patient presents with   Sore Throat    HPI Mark Galvan is a 39 y.o. male.   HPI  History obtained from the patient. Mark Galvan presents for persistent sore throat that started about 3 weeks ago.  Symptoms got better but has not gone away.  He was diagnosed with strep and he is taking amoxicillin but its not getting better.  Has white patches in the back.    Says he lost his first prescription.  He is forgetting to take his amoxicillin from time to time.      History reviewed. No pertinent past medical history.  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 Authorizing Provider  amoxicillin (AMOXIL) 500 MG tablet Take 1,000 mg by mouth daily. 08/21/24  Yes [provider]  azithromycin  (ZITHROMAX  Z-PAK) 250 MG tablet Take 2 tablets on day 1 then 1 tablet daily 09/04/24  Yes Cerra Eisenhower, DO  lidocaine  (XYLOCAINE ) 2 % solution Use as directed 15 mLs in the mouth or throat as needed. Gargle and swallow. 09/04/24  Yes Pairlee Sawtell, DO  Vitamin D, Ergocalciferol, (DRISDOL) 1.25 MG (50000 UNIT) CAPS capsule Take 1 capsule (50,000 Units total) by mouth once a week for 360 days 08/21/24 08/16/25 Yes [provider]  cyanocobalamin (VITAMIN B12) 1000 MCG tablet Take 1,000 mcg by mouth. 12/20/23 12/19/24  [provider]  lidocaine   (LIDODERM ) 5 % Place 1 patch onto the skin daily. Remove & Discard patch within 12 hours or as directed by MD 03/08/24   Ilene Witcher, DO  naproxen  (NAPROSYN ) 500 MG tablet Take 1 tablet (500 mg total) by mouth 2 (two) times daily with a meal. 03/08/24   Ozzy Bohlken, Caprice, DO  albuterol  (PROVENTIL  HFA;VENTOLIN  HFA) 108 (90 Base) MCG/ACT inhaler Inhale 1-2 puffs into the lungs every 4 (four) hours as needed for wheezing or shortness of breath. 09/14/17 11/05/19  Van Knee, MD    Family History Family History  Problem Relation Age of Onset   Healthy Mother    Arrhythmia Father     Social History Social History[1]   Allergies   Penicillin g and Penicillins   Review of Systems Review of Systems: negative unless otherwise stated in HPI.      Physical Exam Triage Vital Signs ED Triage Vitals  Encounter Vitals Group     BP 09/04/24 1536 (!) 153/90     Girls Systolic BP Percentile --      Girls Diastolic BP Percentile --      Boys Systolic BP Percentile --      Boys Diastolic BP Percentile --      Pulse Rate 09/04/24 1536 100     Resp 09/04/24 1536 18     Temp 09/04/24 1536 98.8 F (37.1 C)     Temp Source 09/04/24 1536 Oral  SpO2 09/04/24 1536 96 %     Weight 09/04/24 1532 180 lb 12.8 oz (82 kg)     Height --      Head Circumference --      Peak Flow --      Pain Score 09/04/24 1533 5     Pain Loc --      Pain Education --      Exclude from Growth Chart --    No data found.  Updated Vital Signs BP (!) 153/90 (BP Location: Left Arm)   Pulse 100   Temp 98.8 F (37.1 C) (Oral)   Resp 18   Wt 82 kg   SpO2 96%   BMI 25.94 kg/m   Visual Acuity Right Eye Distance:   Left Eye Distance:   Bilateral Distance:    Right Eye Near:   Left Eye Near:    Bilateral Near:     Physical Exam GEN:     alert, non-toxic appearing male in no distress    HENT:  mucus membranes moist, oropharyngeal with white pustules with surrounding erythema, no appreciable tonsillar  hypertrophy, no nasal discharge  EYES:   no scleral injection or discharge NECK:  normal ROM, +lymphadenopathy, no meningismus   RESP:  no increased work of breathing CVS:   regular rate  Skin:   warm and dry, no rash on visible skin    UC Treatments / Results  Labs (all labs ordered are listed, but only abnormal results are displayed) Labs Reviewed  POCT RAPID STREP A (OFFICE) - Normal  CULTURE, GROUP A STREP [facility])    EKG   Radiology No results found.   Procedures Procedures (including critical care time)  Medications Ordered in UC Medications - No data to display  Initial Impression / Assessment and Plan / UC Course  I have reviewed the triage vital signs and the nursing notes.  Pertinent labs & imaging results that were available during my care of the patient were reviewed by me and considered in my medical decision making (see chart for details).       Pt is a 39 y.o. male who presents for persistent sore throat. Mark Galvan is afebrile here. Satting well on room air. Overall pt is non-toxic appearing, well hydrated, without respiratory distress. Throat exam is remarkable for pustules with erythematous base. POC strep is negative.  Strep throat culture sent. Previous strep culture was positive. Treat with azithromycin  as below given his reported allergy to PCN.  Stressed importance of completing his antibiotic therapy. Discussed symptomatic treatment.  Lidocaine  solution for throat pain.  Typical duration of symptoms discussed.   Return and ED precautions given and voiced understanding. Discussed MDM, treatment plan and plan for follow-up with patient  who agrees with plan.     Final Clinical Impressions(s) / UC Diagnoses   Final diagnoses:  Sore throat  Recurrent streptococcal pharyngitis     Discharge Instructions      Your strep is negative here but I will send your throat culture for verification as it was only seen of your throat culture the last time.     You can take Tylenol  and/or Ibuprofen  as needed for fever reduction and pain relief.     For sore throat: try warm salt water gargles, Mucinex sore throat cough drops or cepacol lozenges, throat spray, warm tea or water with lemon/honey, popsicles or ice, or OTC cold relief medicine for throat discomfort. You can also purchase chloraseptic spray at the pharmacy or dollar store.  It is important to stay hydrated: drink plenty of fluids (water, gatorade/powerade/pedialyte, juices, or teas) to keep your throat moisturized and help further relieve irritation/discomfort.    Return or go to the Emergency Department if symptoms worsen or do not improve in the next few days      ED Prescriptions     Medication Sig Dispense Auth. Provider   azithromycin  (ZITHROMAX  Z-PAK) 250 MG tablet Take 2 tablets on day 1 then 1 tablet daily 6 tablet Kanin Lia, DO   lidocaine  (XYLOCAINE ) 2 % solution Use as directed 15 mLs in the mouth or throat as needed. Gargle and swallow. 100 mL Sonya Gunnoe, DO      PDMP not reviewed this encounter.     [1]  Social History Tobacco Use   Smoking status: Never    Passive exposure: Never   Smokeless tobacco: Former    Types: Snuff  Vaping Use   Vaping status: Never Used  Substance Use Topics   Alcohol use: Yes    Comment: rarely   Drug use: No     Kriste Berth, DO 09/06/24 1142  "

## 2024-09-07 LAB — CULTURE, GROUP A STREP (THRC)
